# Patient Record
Sex: Female | Born: 1965 | Race: Black or African American | Hispanic: No | Marital: Married | Smoking: Never smoker
Health system: Southern US, Community
[De-identification: ages and names within clinical notes are randomized; demographics above are authoritative.]

---

## 2016-11-14 NOTE — Congregational Nurse Program (Unsigned)
Congregational Nurse Program Note  Date of Encounter: 11/14/2016  Past Medical History: No past medical history on file.  Encounter Details:     CNP Questionnaire - 11/14/16 1125      Patient Demographics   Is this a new or existing patient? Existing   Patient is considered a/an Refugee   Race African     Patient Assistance   Location of Patient Assistance Not Applicable   Patient's financial/insurance status Low Income   Uninsured Patient ([employer], trade union) No   Patient referred to apply for the following financial assistance Marketplace or to a [employer] insecurities addressed Not Applicable   Transportation assistance No   Assistance securing medications No   Educational health offerings Not Applicable     Encounter Details   Primary purpose of visit Acute Illness/Condition Visit   Was an Emergency Department visit averted? Not Applicable   Does patient have a medical provider? No   Patient referred to Not Applicable   Was a mental health screening completed? (GAINS tool) No   Does patient have dental issues? No   Does patient have vision issues? No   Does your patient have an abnormal blood pressure today? No   Does your patient have an abnormal blood glucose today? No   Since previous encounter, have you referred patient for abnormal blood glucose that resulted in a new diagnosis or medication change? No   Was there a life-saving intervention made? No      Brief nurse visit for client requesting evaluation of bruised nail on left thumb nailbed. Sustained injury from door closing on finger yesterday.  Nailbed bluish black and reddish around cuticle. No bleeding or drainage at site. Recommended warm water soaks and using OTC pain reliever; consult with pharmacy.  Return to [facility] office on 4/25 to observe thumb. Ferol Luz, RN/CN

## 2016-11-22 ENCOUNTER — Ambulatory Visit (INDEPENDENT_AMBULATORY_CARE_PROVIDER_SITE_OTHER): Payer: BLUE CROSS/BLUE SHIELD | Admitting: Urgent Care

## 2016-11-22 ENCOUNTER — Encounter: Payer: Self-pay | Admitting: Urgent Care

## 2016-11-22 VITALS — BP 144/77 | HR 106 | Temp 97.9°F | Resp 18 | Ht 64.17 in | Wt 180.4 lb

## 2016-11-22 DIAGNOSIS — R21 Rash and other nonspecific skin eruption: Secondary | ICD-10-CM

## 2016-11-22 DIAGNOSIS — R234 Changes in skin texture: Secondary | ICD-10-CM | POA: Diagnosis not present

## 2016-11-22 LAB — POCT SKIN KOH: SKIN KOH, POC: NEGATIVE

## 2016-11-22 LAB — POCT GLYCOSYLATED HEMOGLOBIN (HGB A1C): Hemoglobin A1C: 5.2

## 2016-11-22 MED ORDER — KETOCONAZOLE 2 % EX CREA: 1.0000 "application " | TOPICAL_CREAM | Freq: Every day | CUTANEOUS | 0 refills | Status: DC

## 2016-11-22 MED ORDER — BETAMETHASONE DIPROPIONATE 0.05 % EX CREA: TOPICAL_CREAM | Freq: Two times a day (BID) | CUTANEOUS | 0 refills | Status: DC

## 2016-11-22 NOTE — Progress Notes (Signed)
  MRN: [id number] DOB: [birth year]  Subjective:   Cheyenne Hubbard is a 51 y.o. female presenting to Establ[facility] Care  Rash - Reports longstanding history of different rashes over her body. All rashes are very itchy. They do not cause pain, drainage or bleeding. One large rash is over her right leg, reports that it is discolored and has used betamethasone with good results. She has another rash over her breasts that started in the past few months. And different smaller rashes over her torso. Patient is from [facility] and has been seen and treated there by a dermatologist. She would like to get a refill for her leg rash and also get worked up for her breast rash. Denies history of diabetes, autoimmune disorder.   Shaunika is not currently taking any medications. Also is allergic to quinolones. Aimie denies past medical and surgical history.  Objective:   Vitals: BP (!) 144/77 (BP Location: Right Arm, Patient Position: Sitting, Cuff Size: Small)   Pulse (!) 106   Temp 97.9 F (36.6 C) (Oral)   Resp 18   Ht 5' 4.17" (1.63 m)   Wt 180 lb 6.4 oz (81.8 kg)   LMP 10/24/2016 (Approximate)   SpO2 99%   BMI 30.80 kg/m   Physical Exam  Constitutional: She is oriented to person, place, and time. She appears well-developed and well-nourished.  HENT:  Mouth/Throat: Oropharynx is clear and moist.  Neck: Normal range of motion. Neck supple. No thyromegaly present.  Cardiovascular: Normal rate.   Pulmonary/Chest: Effort normal.  Lymphadenopathy:    She has no cervical adenopathy.  Neurological: She is alert and oriented to person, place, and time.  Skin: Skin is warm and dry. Rash noted.     Psychiatric: She has a normal mood and affect.       Results for orders placed or performed in visit on 11/22/16 (from the past 24 hour(s))  POCT Skin KOH     Status: None   Collection Time: 11/22/16  5:37 PM  Result Value Ref Range   Skin KOH, POC Negative Negative  POCT glycosylated hemoglobin (Hb A1C)      Status: None   Collection Time: 11/22/16  5:49 PM  Result Value Ref Range   Hemoglobin A1C 5.2     Assessment and Plan :   1. Rash and nonspecific skin eruption - Refilled betamethasone since she has had very good results with this rash. Counseled on use and appropriate administration of this cream. Consider biopsy versus referral to dermatology. Recheck in 2 weeks.  2. Breast skin changes - Will manage breast rash as fungal infection. Recheck in 2 weeks. - ketoconazole (NIZORAL) 2 % cream; Apply 1 application topically daily.  Dispense: 15 g; Refill: 0   Wallis Bamberg, PA-C [facility] [phone] 11/22/2016  5:19 PM

## 2016-11-22 NOTE — Patient Instructions (Addendum)
Rash A rash is a change in the color of the skin. A rash can also change the way your skin feels. There are many different conditions and factors that can cause a rash. Follow these instructions at home: Pay attention to any changes in your symptoms. Follow these instructions to help with your condition: Medicine Take or apply over-the-counter and prescription medicines only as told by your health care provider. These may include:  Corticosteroid cream.  Anti-itch lotions.  Oral antihistamines.  Skin Care  Apply cool compresses to the affected areas.  Try taking a bath with: ? Epsom salts. Follow the instructions on the packaging. You can get these at your local pharmacy or grocery store. ? Baking soda. Pour a small amount into the bath as told by your health care provider. ? Colloidal oatmeal. Follow the instructions on the packaging. You can get this at your local pharmacy or grocery store.  Try applying baking soda paste to your skin. Stir water into baking soda until it reaches a paste-like consistency.  Do not scratch or rub your skin.  Avoid covering the rash. Make sure the rash is exposed to air as much as possible. General instructions  Avoid hot showers or baths, which can make itching worse. A cold shower may help.  Avoid scented soaps, detergents, and perfumes. Use gentle soaps, detergents, perfumes, and other cosmetic products.  Avoid any substance that causes your rash. Keep a journal to help track what causes your rash. Write down: ? What you eat. ? What cosmetic products you use. ? What you drink. ? What you wear. This includes jewelry.  Keep all follow-up visits as told by your health care provider. This is important. Contact a health care provider if:  You sweat at night.  You lose weight.  You urinate more than normal.  You feel weak.  You vomit.  Your skin or the whites of your eyes look yellow (jaundice).  Your skin: ? Tingles. ? Is  numb.  Your rash: ? Does not go away after several days. ? Gets worse.  You are: ? Unusually thirsty. ? More tired than normal.  You have: ? New symptoms. ? Pain in your abdomen. ? A fever. ? Diarrhea. Get help right away if:  You develop a rash that covers all or most of your body. The rash may or may not be painful.  You develop blisters that: ? Are on top of the rash. ? Grow larger or grow together. ? Are painful. ? Are inside your nose or mouth.  You develop a rash that: ? Looks like purple pinprick-sized spots all over your body. ? Has a "bull's eye" or looks like a target. ? Is not related to sun exposure, is red and painful, and causes your skin to peel. This information is not intended to replace advice given to you by your health care provider. Make sure you discuss any questions you have with your health care provider. Document Released: 06/30/2002 Document Revised: 12/14/2015 Document Reviewed: 11/25/2014 Elsevier Interactive Patient Education  2017 Elsevier Inc.     IF you received an x-ray today, you will receive an invoice from [facility]. Please contact [facility] at [phone] with questions or concerns regarding your invoice.   IF you received labwork today, you will receive an invoice from [facility]. Please contact [facility] at [phone] with questions or concerns regarding your invoice.   Our billing staff will not be able to assist you with questions regarding bills from these   companies.  You will be contacted with the lab results as soon as they are available. The fastest way to get your results is to activate your My Chart account. Instructions are located on the last page of this paperwork. If you have not heard from us regarding the results in 2 weeks, please contact this office.      

## 2016-11-23 LAB — CBC
HEMATOCRIT: 36.3 % (ref 34.0–46.6)
HEMOGLOBIN: 11 g/dL — AB (ref 11.1–15.9)
MCH: 22.1 pg — ABNORMAL LOW (ref 26.6–33.0)
MCHC: 30.3 g/dL — AB (ref 31.5–35.7)
MCV: 73 fL — ABNORMAL LOW (ref 79–97)
Platelets: 359 10*3/uL (ref 150–379)
RBC: 4.98 x10E6/uL (ref 3.77–5.28)
RDW: 17.1 % — ABNORMAL HIGH (ref 12.3–15.4)
WBC: 8.1 10*3/uL (ref 3.4–10.8)

## 2016-11-23 LAB — ANA W/REFLEX IF POSITIVE: ANA: NEGATIVE

## 2016-11-23 LAB — BASIC METABOLIC PANEL
BUN/Creatinine Ratio: 21 (ref 9–23)
BUN: 15 mg/dL (ref 6–24)
CALCIUM: 9.3 mg/dL (ref 8.7–10.2)
CO2: 25 mmol/L (ref 18–29)
CREATININE: 0.71 mg/dL (ref 0.57–1.00)
Chloride: 103 mmol/L (ref 96–106)
GFR calc Af Amer: 116 mL/min/{1.73_m2} (ref >59)
GFR calc non Af Amer: 100 mL/min/{1.73_m2} (ref >59)
GLUCOSE: 86 mg/dL (ref 65–99)
Potassium: 4.2 mmol/L (ref 3.5–5.2)
SODIUM: 141 mmol/L (ref 134–144)

## 2016-11-23 LAB — SEDIMENTATION RATE: SED RATE: 14 mm/h (ref 0–32)

## 2016-11-23 NOTE — Addendum Note (Signed)
Addended by: Baldwin CrownJOHNSON, Shonique Pelphrey D on: 11/23/2016 04:28 PM   Modules accepted: Orders

## 2016-11-29 LAB — PATHOLOGIST SMEAR REVIEW
BASOS: 1 %
Basophils Absolute: 0.1 10*3/uL (ref 0.0–0.2)
EOS (ABSOLUTE): 0.1 10*3/uL (ref 0.0–0.4)
EOS: 2 %
HEMATOCRIT: 36.3 % (ref 34.0–46.6)
HEMOGLOBIN: 11 g/dL — AB (ref 11.1–15.9)
IMMATURE GRANS (ABS): 0 10*3/uL (ref 0.0–0.1)
IMMATURE GRANULOCYTES: 0 %
Lymphocytes Absolute: 2.8 10*3/uL (ref 0.7–3.1)
Lymphs: 34 %
MCH: 22.1 pg — AB (ref 26.6–33.0)
MCHC: 30.3 g/dL — AB (ref 31.5–35.7)
MCV: 73 fL — AB (ref 79–97)
MONOS ABS: 0.3 10*3/uL (ref 0.1–0.9)
Monocytes: 4 %
NEUTROS ABS: 4.9 10*3/uL (ref 1.4–7.0)
Neutrophils: 59 %
PATH REV WBC: NORMAL
Path Rev PLTs: NORMAL
Platelets: 359 10*3/uL (ref 150–379)
RBC: 4.98 x10E6/uL (ref 3.77–5.28)
RDW: 17.1 % — ABNORMAL HIGH (ref 12.3–15.4)
WBC: 8.1 10*3/uL (ref 3.4–10.8)

## 2016-11-29 LAB — SPECIMEN STATUS REPORT

## 2016-12-06 ENCOUNTER — Ambulatory Visit (INDEPENDENT_AMBULATORY_CARE_PROVIDER_SITE_OTHER): Payer: BLUE CROSS/BLUE SHIELD | Admitting: Urgent Care

## 2016-12-06 ENCOUNTER — Encounter: Payer: Self-pay | Admitting: Urgent Care

## 2016-12-06 VITALS — BP 124/79 | HR 96 | Temp 99.0°F | Resp 18 | Ht 64.17 in | Wt 182.0 lb

## 2016-12-06 DIAGNOSIS — D649 Anemia, unspecified: Secondary | ICD-10-CM

## 2016-12-06 DIAGNOSIS — R21 Rash and other nonspecific skin eruption: Secondary | ICD-10-CM | POA: Diagnosis not present

## 2016-12-06 DIAGNOSIS — B369 Superficial mycosis, unspecified: Secondary | ICD-10-CM | POA: Diagnosis not present

## 2016-12-06 MED ORDER — FLUCONAZOLE 150 MG PO TABS: 150.0000 mg | ORAL_TABLET | ORAL | 0 refills | Status: DC

## 2016-12-06 NOTE — Progress Notes (Signed)
   MRN: [id number] DOB: 01/02/1966  Subjective:   Cheyenne Hubbard is a 51 y.o. female presenting for follow up on skin rash, anemia.  Rash - Patient was last seen on 11/22/2016, started on ketoconazole for a breast rash. She reports significant improvement but has not achieved complete resolution. She also started betamethasone for a rash over her right anterior leg and two patches over her back. She noted improvement while on the steroid cream but did not achieve resolution and the itching has worsened.  Anemia - Denies a history of anemia, taking iron supplementation. She denies chills, cold intolerance, pica, dizziness, heart racing.    Janann AugustHelene has a current medication list which includes the following prescription(s): betamethasone dipropionate and ketoconazole. Also is allergic to quinolones.  Janann AugustHelene  has no past medical history on file. Also  has no past surgical history on file.  Objective:   Vitals: BP 124/79   Pulse 96   Temp 99 F (37.2 C) (Oral)   Resp 18   Ht 5' 4.17" (1.63 m)   Wt 182 lb (82.6 kg)   LMP 12/05/2016   SpO2 99%   BMI 31.07 kg/m   Physical Exam  Constitutional: She is oriented to person, place, and time. She appears well-developed and well-nourished.  HENT:  Mouth/Throat: Oropharynx is clear and moist.  Cardiovascular: Normal rate, regular rhythm and intact distal pulses.  Exam reveals no gallop and no friction rub.   No murmur heard. Pulmonary/Chest: No respiratory distress. She has no wheezes. She has no rales.    Neurological: She is alert and oriented to person, place, and time.  Skin: Skin is warm and dry.     Psychiatric: She has a normal mood and affect.   Assessment and Plan :   1. Anemia, unspecified type - Unclear etiology, patient is asymptomatic, labs pending. Follow up with results. - CBC with Differential/Platelet - Ferritin - Iron and TIBC  2. Fungal infection of skin 3. Rash and nonspecific skin eruption - Given  improvement with ketoconazole cream, I will have patient take diflucan once weekly for 4 weeks. Follow up thereafter. - I will refer patient for biopsies and continued management of her lower leg rash to dermatology. - Ambulatory referral to Dermatology   Wallis BambergMario Levis Nazir, PA-C [facility] [phone] 12/06/2016 5:40 PM

## 2016-12-06 NOTE — Patient Instructions (Addendum)
I am going to refer you to a dermatologist for a biopsy of your lower leg rash. Please be on the look out for a call regarding your appointment. In the meantime, I want you to use Zyrtec and Zantac for itching. Please use betamethasone for one week at a time and then give your skin a 2 week break.     IF you received an x-ray today, you will receive an invoice from [facility]. Please contact [facility] at [phone] with questions or concerns regarding your invoice.   IF you received labwork today, you will receive an invoice from [facility]. Please contact [facility] at [phone] with questions or concerns regarding your invoice.   Our billing staff will not be able to assist you with questions regarding bills from these companies.  You will be contacted with the lab results as soon as they are available. The fastest way to get your results is to activate your My Chart account. Instructions are located on the last page of this paperwork. If you have not heard from us regarding the results in 2 weeks, please contact this office.

## 2016-12-07 LAB — CBC WITH DIFFERENTIAL/PLATELET
BASOS: 1 %
Basophils Absolute: 0 10*3/uL (ref 0.0–0.2)
EOS (ABSOLUTE): 0.1 10*3/uL (ref 0.0–0.4)
EOS: 1 %
HEMOGLOBIN: 10.9 g/dL — AB (ref 11.1–15.9)
Hematocrit: 36.2 % (ref 34.0–46.6)
IMMATURE GRANS (ABS): 0 10*3/uL (ref 0.0–0.1)
IMMATURE GRANULOCYTES: 0 %
LYMPHS: 31 %
Lymphocytes Absolute: 2.2 10*3/uL (ref 0.7–3.1)
MCH: 21.9 pg — AB (ref 26.6–33.0)
MCHC: 30.1 g/dL — ABNORMAL LOW (ref 31.5–35.7)
MCV: 73 fL — AB (ref 79–97)
MONOCYTES: 6 %
Monocytes Absolute: 0.4 10*3/uL (ref 0.1–0.9)
NEUTROS ABS: 4.4 10*3/uL (ref 1.4–7.0)
NEUTROS PCT: 61 %
PLATELETS: 327 10*3/uL (ref 150–379)
RBC: 4.97 x10E6/uL (ref 3.77–5.28)
RDW: 16.8 % — ABNORMAL HIGH (ref 12.3–15.4)
WBC: 7.2 10*3/uL (ref 3.4–10.8)

## 2016-12-07 LAB — FERRITIN: Ferritin: 36 ng/mL (ref 15–150)

## 2016-12-07 LAB — IRON AND TIBC
IRON SATURATION: 12 % — AB (ref 15–55)
IRON: 33 ug/dL (ref 27–159)
Total Iron Binding Capacity: 266 ug/dL (ref 250–450)
UIBC: 233 ug/dL (ref 131–425)

## 2016-12-11 ENCOUNTER — Other Ambulatory Visit: Payer: Self-pay | Admitting: Urgent Care

## 2016-12-11 MED ORDER — FERROUS SULFATE 325 (65 FE) MG PO TABS: 325.0000 mg | ORAL_TABLET | Freq: Two times a day (BID) | ORAL | 2 refills | Status: DC

## 2017-01-09 ENCOUNTER — Encounter: Payer: Self-pay | Admitting: Urgent Care

## 2017-01-09 ENCOUNTER — Ambulatory Visit (INDEPENDENT_AMBULATORY_CARE_PROVIDER_SITE_OTHER): Payer: BLUE CROSS/BLUE SHIELD | Admitting: Urgent Care

## 2017-01-09 VITALS — BP 114/75 | HR 104 | Temp 98.2°F | Resp 18 | Ht 64.0 in | Wt 184.2 lb

## 2017-01-09 DIAGNOSIS — R21 Rash and other nonspecific skin eruption: Secondary | ICD-10-CM | POA: Diagnosis not present

## 2017-01-09 DIAGNOSIS — L418 Other parapsoriasis: Secondary | ICD-10-CM | POA: Diagnosis not present

## 2017-01-09 DIAGNOSIS — B3789 Other sites of candidiasis: Secondary | ICD-10-CM

## 2017-01-09 NOTE — Progress Notes (Signed)
    MRN: [id number] DOB: 14-Oct-1965  Subjective:   Cheyenne BoutonHelene Ngoy Hubbard is a 51 y.o. female presenting for follow up on rashes.  Breast candidiasis - Patient reports dramatic improvement. She has undergone both treatment with topical ketoconazole followed by oral diflucan. Denies itching, breast rash.  Leg rash - Reports ongoing recurrent right lower leg rash. She has undergone treatment with betamethasone 0.05% for an undifferentiated rash which is what she used while in [place]. She reports improvement while on the steroid cream but once she stops using it after a week the rash comes back. Patient has been referred to dermatology but the appointment is scheduled for 01/30/2017.  Cheyenne AugustHelene has a current medication list which includes the following prescription(s): betamethasone dipropionate, ferrous sulfate, and ketoconazole. Also is allergic to quinolones. Cheyenne AugustHelene denies past medical and surgical history.  Objective:   Vitals: BP 114/75   Pulse (!) 104   Temp 98.2 F (36.8 C) (Oral)   Resp 18   Ht 5\' 4"  (1.626 m)   Wt 184 lb 3.2 oz (83.6 kg)   SpO2 100%   BMI 31.62 kg/m   Pulse was 80 on recheck by PA-Shamicka Inga.  Physical Exam  Constitutional: She is oriented to person, place, and time. She appears well-developed and well-nourished.  Cardiovascular: Normal rate.   Pulmonary/Chest: Effort normal.  Neurological: She is alert and oriented to person, place, and time.  Skin:      PROCEDURE NOTE: Punch biopsy Verbal consent obtain from the patient.  Skin cleansed with alcohol pad, then local anesthesia with 1 cc 1% lidocaine with epinephrine.   A 3mm punch biopsy performed and specimen sent for pathology review. Hemostasis achieved with silver nitrate and pressure. Bandage applied. Local wound care reviewed.   Assessment and Plan :   This case was precepted with Dr. Alwyn RenHopper.   1. Rash and nonspecific skin eruption - Biopsy obtained, hold off on additional steroid cream use for now.  -  Dermatology pathology  2. Candidiasis of breast - Resolved.  Wallis BambergMario Jonee Lamore, PA-C [facility] [phone] 01/09/2017 5:50 PM

## 2017-01-09 NOTE — Patient Instructions (Addendum)
Change your dressing daily until the wound closes. Come back in 1 week to review your results and wound.    Skin Biopsy, Care After Refer to this sheet in the next few weeks. These instructions provide you with information about caring for yourself after your procedure. Your health care provider may also give you more specific instructions. Your treatment has been planned according to current medical practices, but problems sometimes occur. Call your health care provider if you have any problems or questions after your procedure. What can I expect after the procedure? After the procedure, it is common to have:  Soreness.  Bruising.  Itching.  Follow these instructions at home:  Rest and then return to your normal activities as told by your health care provider.  Take over-the-counter and prescription medicines only as told by your health care provider.  Follow instructions from your health care provider about how to take care of your biopsy site.Make sure you: ? Wash your hands with soap and water before you change your bandage (dressing). If soap and water are not available, use hand sanitizer. ? Change your dressing as told by your health care provider. ? Leave stitches (sutures), skin glue, or adhesive strips in place. These skin closures may need to stay in place for 2 weeks or longer. If adhesive strip edges start to loosen and curl up, you may trim the loose edges. Do not remove adhesive strips completely unless your health care provider tells you to do that. If the biopsy area bleeds, apply gentle pressure for 10 minutes.  Check your biopsy site every day for signs of infection. Check for: ? More redness, swelling, or pain. ? More fluid or blood. ? Warmth. ? Pus or a bad smell.  Keep all follow-up visits as told by your health care provider. This is important. Contact a health care provider if:  You have more redness, swelling, or pain around your biopsy site.  You have  more fluid or blood coming from your biopsy site.  Your biopsy site feels warm to the touch.  You have pus or a bad smell coming from your biopsy site.  You have a fever. Get help right away if:  You have bleeding that does not stop with pressure or a dressing. This information is not intended to replace advice given to you by your health care provider. Make sure you discuss any questions you have with your health care provider. Document Released: 08/06/2015 Document Revised: 03/05/2016 Document Reviewed: 10/07/2014 Elsevier Interactive Patient Education  2018 [employer]     IF you received an x-ray today, you will receive an invoice from [facility]. Please contact [facility] at [phone] with questions or concerns regarding your invoice.   IF you received labwork today, you will receive an invoice from [facility]. Please contact [facility] at [phone] with questions or concerns regarding your invoice.   Our billing staff will not be able to assist you with questions regarding bills from these companies.  You will be contacted with the lab results as soon as they are available. The fastest way to get your results is to activate your My Chart account. Instructions are located on the last page of this paperwork. If you have not heard from us regarding the results in 2 weeks, please contact this office.

## 2017-01-16 ENCOUNTER — Ambulatory Visit (INDEPENDENT_AMBULATORY_CARE_PROVIDER_SITE_OTHER): Payer: BLUE CROSS/BLUE SHIELD | Admitting: Urgent Care

## 2017-01-16 ENCOUNTER — Encounter: Payer: Self-pay | Admitting: Urgent Care

## 2017-01-16 VITALS — BP 110/64 | HR 89 | Temp 98.8°F | Resp 16 | Ht 63.5 in | Wt 187.8 lb

## 2017-01-16 DIAGNOSIS — R21 Rash and other nonspecific skin eruption: Secondary | ICD-10-CM

## 2017-01-16 DIAGNOSIS — T148XXA Other injury of unspecified body region, initial encounter: Secondary | ICD-10-CM

## 2017-01-16 DIAGNOSIS — L089 Local infection of the skin and subcutaneous tissue, unspecified: Secondary | ICD-10-CM

## 2017-01-16 MED ORDER — CEPHALEXIN 500 MG PO CAPS
500.0000 mg | ORAL_CAPSULE | Freq: Three times a day (TID) | ORAL | 0 refills | Status: DC
Start: 2017-01-16 — End: 2018-04-05

## 2017-01-16 NOTE — Progress Notes (Signed)
    MRN: [id number] DOB: 1965-08-27  Subjective:   Cheyenne Hubbard is a 51 y.o. female presenting for follow up on lower leg rash. Patient had a punch biopsy performed on 01/09/2017. Results were reviewed with patient. Today, patient reports that she has changed her dressings daily. Denies pain, fever, redness. Has had some drainage.    Cheyenne Hubbard has a current medication list which includes the following prescription(s): betamethasone dipropionate, ferrous sulfate, and ketoconazole. Also is allergic to quinolones. Cheyenne Hubbard denies past medical and surgical history.   Objective:   Vitals: BP 110/64 (BP Location: Right Arm, Patient Position: Sitting, Cuff Size: Large)   Pulse 89   Temp 98.8 F (37.1 C) (Oral)   Resp 16   Ht 5' 3.5" (1.613 m)   Wt 187 lb 12.8 oz (85.2 kg)   LMP 01/01/2017   SpO2 100%   BMI 32.75 kg/m   Physical Exam  Constitutional: She is oriented to person, place, and time. She appears well-developed and well-nourished.  Cardiovascular: Normal rate.   Pulmonary/Chest: Effort normal.  Neurological: She is alert and oriented to person, place, and time.  Skin:      WOUND CARE - Dressing removed. Non-viable tissue debrided with forceps and Iris scissors. Cleansed and dressed.  Assessment and Plan :   1. Rash and nonspecific skin eruption 2. Wound infection - Start keflex to address wound infection. Wound care reviewed. Patient is to keep appointment with dermatology. Follow up in 1 week for wound recheck.  Wallis BambergMario Doshia Dalia, PA-C [facility] [phone] 01/16/2017 5:32 PM

## 2017-01-16 NOTE — Patient Instructions (Addendum)
Change your dressing daily until your wound closes. You may use the betamethasone cream for one more week. Please keep your appointment with dermatology to keep working on your rash.    IF you received an x-ray today, you will receive an invoice from [facility]. Please contact [facility] at [phone] with questions or concerns regarding your invoice.   IF you received labwork today, you will receive an invoice from [facility]. Please contact [facility] at [phone] with questions or concerns regarding your invoice.   Our billing staff will not be able to assist you with questions regarding bills from these companies.  You will be contacted with the lab results as soon as they are available. The fastest way to get your results is to activate your My Chart account. Instructions are located on the last page of this paperwork. If you have not heard from us regarding the results in 2 weeks, please contact this office.    We recommend that you schedule a mammogram for breast cancer screening. Typically, you do not need a referral to do this. Please contact a local imaging center to schedule your mammogram.  [facility] - [phone]  *ask for the Radiology Department [facility] - [phone] or [phone]  [facility] - [phone] [facility] - [phone] [facility] - [phone]  *ask for the Radiology Department [facility] - [phone]  *ask for the Radiology Department [facility] - [phone]  *ask for the Mammography Department [facility] - [phone]

## 2017-01-27 ENCOUNTER — Ambulatory Visit (INDEPENDENT_AMBULATORY_CARE_PROVIDER_SITE_OTHER): Payer: BLUE CROSS/BLUE SHIELD | Admitting: Physician Assistant

## 2017-01-27 VITALS — BP 115/72 | HR 77 | Temp 98.5°F | Resp 16 | Ht 64.0 in | Wt 189.3 lb

## 2017-01-27 DIAGNOSIS — Z23 Encounter for immunization: Secondary | ICD-10-CM

## 2017-01-27 DIAGNOSIS — Z789 Other specified health status: Secondary | ICD-10-CM | POA: Diagnosis not present

## 2017-01-27 DIAGNOSIS — Z111 Encounter for screening for respiratory tuberculosis: Secondary | ICD-10-CM

## 2017-01-27 DIAGNOSIS — R21 Rash and other nonspecific skin eruption: Secondary | ICD-10-CM | POA: Diagnosis not present

## 2017-01-27 NOTE — Patient Instructions (Addendum)
I will contact     IF you received an x-ray today, you will receive an invoice from [facility]. Please contact [facility] at [phone] with questions or concerns regarding your invoice.   IF you received labwork today, you will receive an invoice from [facility]. Please contact [facility] at [phone] with questions or concerns regarding your invoice.   Our billing staff will not be able to assist you with questions regarding bills from these companies.  You will be contacted with the lab results as soon as they are available. The fastest way to get your results is to activate your My Chart account. Instructions are located on the last page of this paperwork. If you have not heard from us regarding the results in 2 weeks, please contact this office.

## 2017-01-28 NOTE — Progress Notes (Signed)
PRIMARY CARE AT [place], [place] [address] [phone]  Date:  01/27/2017   Name:  [name]   DOB:  1965/11/26   MRN:  [id number]  PCP:  Patient, No Pcp Per    History of Present Illness:  Cheyenne Hubbard is a 51 y.o. female patient who presents to PCP with  Chief Complaint  Patient presents with  . Leg Problem    wound care right leg     Patient is here for wound care.  She has had a rash of the right lower leg.  Skin biopsy 18 days ago appears to be paraphimosis.  She is covering her wound.  The biopsy area is healing without drainage.   July 10th is her date with dermatology for consult.    Patient Active Problem List   Diagnosis Date Noted  . Fungal infection of skin 12/06/2016  . Rash and nonspecific skin eruption 12/06/2016  . Anemia 12/06/2016    No past medical history on file.  No past surgical history on file.  Social History  Substance Use Topics  . Smoking status: Never Smoker  . Smokeless tobacco: Never Used  . Alcohol use Yes     Comment: occ    Family History  Problem Relation Age of Onset  . Hypertension Mother   . Hypertension Father     Allergies  Allergen Reactions  . Quinolones Itching    Medication list has been reviewed and updated.  Current Outpatient Prescriptions on File Prior to Visit  Medication Sig Dispense Refill  . betamethasone dipropionate (DIPROLENE) 0.05 % cream Apply topically 2 (two) times daily. 30 g 0  . cephALEXin (KEFLEX) 500 MG capsule Take 1 capsule (500 mg total) by mouth 3 (three) times daily. 30 capsule 0  . ferrous sulfate 325 (65 FE) MG tablet Take 1 tablet (325 mg total) by mouth 2 (two) times daily with a meal. 60 tablet 2   No current facility-administered medications on file prior to visit.     ROS ROS otherwise unremarkable unless listed above.  Physical Examination: BP 115/72   Pulse 77   Temp 98.5 F (36.9 C) (Oral)   Resp 16   Ht 5' 4"  (1.626 m)   Wt 189 lb 4.8 oz  (85.9 kg)   LMP 01/01/2017   SpO2 99%   BMI 32.49 kg/m  Ideal Body Weight: Weight in (lb) to have BMI = 25: 145.3  Physical Exam  Constitutional: She is oriented to person, place, and time. She appears well-developed and well-nourished. No distress.  HENT:  Head: Normocephalic and atraumatic.  Right Ear: External ear normal.  Left Ear: External ear normal.  Eyes: Conjunctivae and EOM are normal. Pupils are equal, round, and reactive to light.  Cardiovascular: Normal rate.   Pulmonary/Chest: Effort normal. No respiratory distress.  Neurological: She is alert and oriented to person, place, and time.  Skin: Skin is warm and dry. Capillary refill takes less than 2 seconds. She is not diaphoretic.  Right leg biopsy area, is healing well.  No drainage of purulent or sanguinous fluid.    Psychiatric: She has a normal mood and affect. Her behavior is normal.     Assessment and Plan: Cheyenne Hubbard is a 51 y.o. female who is here today for rash and immunizations for work. Biopsy healing well.  Continue to go to dermatology visit.   --Screening-pulmonary TB - Plan: Quantiferon tb gold assay (blood)  Varicella vaccination status unknown -  Plan: Varicella Zoster Abs, IgG/IgM  Need for tetanus booster - Plan: Td vaccine greater than or equal to 7yo preservative free IM  Measles, mumps, rubella (MMR) vaccination status unknown - Plan: Measles/Mumps/Rubella Immunity  Ivar Drape, PA-C [facility] 7/9/20188:21 AM

## 2017-01-29 ENCOUNTER — Encounter: Payer: Self-pay | Admitting: Physician Assistant

## 2017-01-29 LAB — MEASLES/MUMPS/RUBELLA IMMUNITY
MUMPS ABS, IGG: 179 [AU]/ml (ref >10.9)
Rubella Antibodies, IGG: 18.5 index (ref >0.99)

## 2017-01-29 LAB — VARICELLA ZOSTER ABS, IGG/IGM: Varicella zoster IgG: 1133 index (ref >165)

## 2017-01-30 DIAGNOSIS — L309 Dermatitis, unspecified: Secondary | ICD-10-CM | POA: Diagnosis not present

## 2017-01-30 LAB — QUANTIFERON IN TUBE
QFT TB AG MINUS NIL VALUE: <0 IU/mL
QUANTIFERON MITOGEN VALUE: 8.9 IU/mL
QUANTIFERON NIL VALUE: 0.08 [IU]/mL
QUANTIFERON TB AG VALUE: 0.06 IU/mL
QUANTIFERON TB GOLD: NEGATIVE

## 2017-01-30 LAB — QUANTIFERON TB GOLD ASSAY (BLOOD)

## 2017-01-31 ENCOUNTER — Encounter: Payer: Self-pay | Admitting: Radiology

## 2017-02-27 DIAGNOSIS — L409 Psoriasis, unspecified: Secondary | ICD-10-CM | POA: Diagnosis not present

## 2017-04-24 DIAGNOSIS — L309 Dermatitis, unspecified: Secondary | ICD-10-CM | POA: Diagnosis not present

## 2017-06-06 DIAGNOSIS — L309 Dermatitis, unspecified: Secondary | ICD-10-CM | POA: Diagnosis not present

## 2017-08-16 DIAGNOSIS — L309 Dermatitis, unspecified: Secondary | ICD-10-CM | POA: Diagnosis not present

## 2017-10-03 NOTE — Congregational Nurse Program (Unsigned)
Congregational Nurse Program Note  Date of Encounter: 10/03/2017  Past Medical History: No past medical history on file.  Encounter Details: CNP Questionnaire - 10/03/17 1432      Questionnaire   Patient Status  Refugee    Race  African    Location Patient Served At  Not Applicable    Medical Provider  No    Referrals  Not Applicable    ED Visit Averted  Not Applicable      Office visit with complaint of intermittent pain in left heel while walking. Pain noticeable wearing certain type of shoe. Wearing a flat canvas type shoe without elevation. Denies injury. No swelling or deformity of foot. Skin intact and color normal.Slight pain to pressure over talus area. Recommended changing shoes; warm soaks for foot and OTC for pain. Contact PCP for evaluation and referral. Return for further assistance prn. Ferol LuzMarietta Harles Evetts, RN/CN

## 2017-10-23 ENCOUNTER — Encounter: Payer: Self-pay | Admitting: Physician Assistant

## 2018-04-05 ENCOUNTER — Other Ambulatory Visit: Payer: Self-pay

## 2018-04-05 ENCOUNTER — Ambulatory Visit (INDEPENDENT_AMBULATORY_CARE_PROVIDER_SITE_OTHER): Payer: BLUE CROSS/BLUE SHIELD | Admitting: Urgent Care

## 2018-04-05 ENCOUNTER — Encounter: Payer: Self-pay | Admitting: Urgent Care

## 2018-04-05 VITALS — BP 146/82 | HR 79 | Temp 98.2°F | Resp 18 | Ht 64.0 in | Wt 184.8 lb

## 2018-04-05 DIAGNOSIS — R030 Elevated blood-pressure reading, without diagnosis of hypertension: Secondary | ICD-10-CM

## 2018-04-05 DIAGNOSIS — E669 Obesity, unspecified: Secondary | ICD-10-CM

## 2018-04-05 DIAGNOSIS — Z1329 Encounter for screening for other suspected endocrine disorder: Secondary | ICD-10-CM | POA: Diagnosis not present

## 2018-04-05 DIAGNOSIS — Z13220 Encounter for screening for lipoid disorders: Secondary | ICD-10-CM | POA: Diagnosis not present

## 2018-04-05 DIAGNOSIS — Z130 Encounter for screening for diseases of the blood and blood-forming organs and certain disorders involving the immune mechanism: Secondary | ICD-10-CM

## 2018-04-05 DIAGNOSIS — Z114 Encounter for screening for human immunodeficiency virus [HIV]: Secondary | ICD-10-CM

## 2018-04-05 DIAGNOSIS — R21 Rash and other nonspecific skin eruption: Secondary | ICD-10-CM

## 2018-04-05 DIAGNOSIS — Z124 Encounter for screening for malignant neoplasm of cervix: Secondary | ICD-10-CM

## 2018-04-05 DIAGNOSIS — Z1211 Encounter for screening for malignant neoplasm of colon: Secondary | ICD-10-CM

## 2018-04-05 DIAGNOSIS — Z0001 Encounter for general adult medical examination with abnormal findings: Secondary | ICD-10-CM | POA: Diagnosis not present

## 2018-04-05 DIAGNOSIS — Z1231 Encounter for screening mammogram for malignant neoplasm of breast: Secondary | ICD-10-CM

## 2018-04-05 DIAGNOSIS — Z13228 Encounter for screening for other metabolic disorders: Secondary | ICD-10-CM

## 2018-04-05 DIAGNOSIS — Z0000 Encounter for general adult medical examination without abnormal findings: Secondary | ICD-10-CM

## 2018-04-05 DIAGNOSIS — Z1239 Encounter for other screening for malignant neoplasm of breast: Secondary | ICD-10-CM

## 2018-04-05 DIAGNOSIS — L300 Nummular dermatitis: Secondary | ICD-10-CM

## 2018-04-05 MED ORDER — HYDROXYZINE HCL 25 MG PO TABS: 12.5000 mg | ORAL_TABLET | Freq: Three times a day (TID) | ORAL | 5 refills | Status: DC | PRN

## 2018-04-05 MED ORDER — TRIAMCINOLONE ACETONIDE 0.1 % EX CREA: 1.0000 "application " | TOPICAL_CREAM | Freq: Two times a day (BID) | CUTANEOUS | 1 refills | Status: DC

## 2018-04-05 NOTE — Progress Notes (Signed)
MRN: [id number]  Subjective:   Cheyenne Hubbard is a 52 y.o. female presenting for annual physical exam. Patient is happily married. Denies smoking cigarettes or drinking alcohol. Has good relationships at home, has a good support network. Patient is a Arts development officerhome maker.   Medical care team includes: PCP: Patient, No Pcp Per OB/GYN: Patient is due for pap smear, breast cancer screening. Specialists: Dermatologist. Patient would like another referral. She was prescribed a triamcinolone cream for a persistent lower leg rash and had no follow up.  Health Maintenance: Due for colonoscopy.    Cheyenne AugustHelene is not currently taking any medications.  She is allergic to quinolones. Cheyenne AugustHelene has pmh of rash. Denies past surgical history. Her family history includes Hypertension in her father and mother.  Review of Systems  Constitutional: Negative for chills, diaphoresis, fever, malaise/fatigue and weight loss.  HENT: Negative for congestion, ear discharge, ear pain, hearing loss, nosebleeds, sore throat and tinnitus.   Eyes: Negative for blurred vision, double vision, photophobia, pain, discharge and redness.  Respiratory: Negative for cough, shortness of breath and wheezing.   Cardiovascular: Negative for chest pain, palpitations and leg swelling.  Gastrointestinal: Negative for abdominal pain, blood in stool, constipation, diarrhea, nausea and vomiting.  Genitourinary: Negative for dysuria, flank pain, frequency, hematuria and urgency.  Musculoskeletal: Negative for back pain, joint pain and myalgias.  Skin: Negative for itching and rash.  Neurological: Negative for dizziness, tingling, seizures, loss of consciousness, weakness and headaches.  Endo/Heme/Allergies: Negative for polydipsia.  Psychiatric/Behavioral: Negative for depression, hallucinations, memory loss, substance abuse and suicidal ideas. The patient is not nervous/anxious and does not have insomnia.     Objective:   Vitals: BP (!)  146/82   Pulse 79   Temp 98.2 F (36.8 C) (Oral)   Resp 18   Ht 5\' 4"  (1.626 m)   Wt 184 lb 12.8 oz (83.8 kg)   SpO2 99%   BMI 31.72 kg/m   BP Readings from Last 3 Encounters:  04/05/18 (!) 146/82  10/03/17 115/68  01/27/17 115/72    Wt Readings from Last 3 Encounters:  04/05/18 184 lb 12.8 oz (83.8 kg)  01/27/17 189 lb 4.8 oz (85.9 kg)  01/16/17 187 lb 12.8 oz (85.2 kg)    Physical Exam  Constitutional: She is oriented to person, place, and time. She appears well-developed and well-nourished.  HENT:  TM's intact bilaterally, no effusions or erythema. Nasal turbinates pink and moist, nasal passages patent. No sinus tenderness. Oropharynx clear, mucous membranes moist, dentition in good repair.  Eyes: Pupils are equal, round, and reactive to light. Conjunctivae and EOM are normal. Right eye exhibits no discharge. Left eye exhibits no discharge. No scleral icterus.  Neck: Normal range of motion. Neck supple. No thyromegaly present.  Cardiovascular: Normal rate, regular rhythm and intact distal pulses. Exam reveals no gallop and no friction rub.  No murmur heard. Pulmonary/Chest: No respiratory distress. She has no wheezes. She has no rales.  Abdominal: Soft. Bowel sounds are normal. She exhibits no distension and no mass. There is no tenderness. There is no rebound and no guarding.  Musculoskeletal: Normal range of motion. She exhibits no edema or tenderness.  Lymphadenopathy:    She has no cervical adenopathy.  Neurological: She is alert and oriented to person, place, and time. She has normal reflexes. She displays normal reflexes. Coordination normal.  Skin: Skin is warm and dry. Rash (as depicted) noted. No erythema. No pallor.  Psychiatric: She has a normal mood and affect.  Assessment and Plan :   Annual physical exam  Screening for deficiency anemia  Screening for thyroid disorder  Screening for lipid disorders  Screening for metabolic disorder  Rash and  nonspecific skin eruption - Plan: HIV Antibody (routine testing w rflx), Ambulatory referral to Dermatology  Elevated blood pressure reading - Plan: Comprehensive metabolic panel, TSH, CBC  Screening for HIV (human immunodeficiency virus) - Plan: HIV Antibody (routine testing w rflx)  Obesity without serious comorbidity, unspecified classification, unspecified obesity type - Plan: Lipid panel  Pap smear for cervical cancer screening - Plan: Ambulatory referral to Gynecology  Screen for colon cancer - Plan: Ambulatory referral to Gastroenterology  Screening for breast cancer - Plan: MM Digital Screening  Nummular eczematous dermatitis  Referral to a different dermatologist.  Patient previously had treatment for fungal infection, nummular eczematous dermatitis that shown by skin biopsy.  She continues to use steroid cream alternating every other 2 weeks between uses.  Otherwise she reports that she is in good health.  She does not know why her blood pressure is up today, does not have a history of hypertension and her previous readings have been good.  She is otherwise largely asymptomatic.  Screening for her health maintenance includes mammogram, colonoscopy, Pap smear.  Referrals are pending.  Patient refused the flu vaccine today.  She would like to come back with all her family to get it together. Discussed healthy lifestyle, diet, exercise, preventative care, vaccinations, and addressed patient's concerns.     Wallis Bamberg, PA-C Primary Care at [facility] [phone] 04/05/2018  1:49 PM

## 2018-04-05 NOTE — Patient Instructions (Addendum)

## 2018-04-06 LAB — CBC
HEMATOCRIT: 37.2 % (ref 34.0–46.6)
HEMOGLOBIN: 10.8 g/dL — AB (ref 11.1–15.9)
MCH: 21.6 pg — AB (ref 26.6–33.0)
MCHC: 29 g/dL — AB (ref 31.5–35.7)
MCV: 74 fL — ABNORMAL LOW (ref 79–97)
Platelets: 348 10*3/uL (ref 150–450)
RBC: 5.01 x10E6/uL (ref 3.77–5.28)
RDW: 14.4 % (ref 12.3–15.4)
WBC: 5.8 10*3/uL (ref 3.4–10.8)

## 2018-04-06 LAB — COMPREHENSIVE METABOLIC PANEL
A/G RATIO: 1.3 (ref 1.2–2.2)
ALK PHOS: 49 IU/L (ref 39–117)
ALT: 15 IU/L (ref 0–32)
AST: 15 IU/L (ref 0–40)
Albumin: 3.9 g/dL (ref 3.5–5.5)
BILIRUBIN TOTAL: 0.3 mg/dL (ref 0.0–1.2)
BUN/Creatinine Ratio: 13 (ref 9–23)
BUN: 9 mg/dL (ref 6–24)
CHLORIDE: 104 mmol/L (ref 96–106)
CO2: 23 mmol/L (ref 20–29)
Calcium: 8.9 mg/dL (ref 8.7–10.2)
Creatinine, Ser: 0.68 mg/dL (ref 0.57–1.00)
GFR calc Af Amer: 118 mL/min/{1.73_m2} (ref >59)
GFR, EST NON AFRICAN AMERICAN: 102 mL/min/{1.73_m2} (ref >59)
Globulin, Total: 2.9 g/dL (ref 1.5–4.5)
Glucose: 79 mg/dL (ref 65–99)
POTASSIUM: 3.6 mmol/L (ref 3.5–5.2)
Sodium: 140 mmol/L (ref 134–144)
Total Protein: 6.8 g/dL (ref 6.0–8.5)

## 2018-04-06 LAB — HIV ANTIBODY (ROUTINE TESTING W REFLEX): HIV SCREEN 4TH GENERATION: NONREACTIVE

## 2018-04-06 LAB — LIPID PANEL
CHOL/HDL RATIO: 2.5 ratio (ref 0.0–4.4)
Cholesterol, Total: 123 mg/dL (ref 100–199)
HDL: 50 mg/dL (ref >39)
LDL Calculated: 66 mg/dL (ref 0–99)
TRIGLYCERIDES: 35 mg/dL (ref 0–149)
VLDL Cholesterol Cal: 7 mg/dL (ref 5–40)

## 2018-04-06 LAB — TSH: TSH: 1.58 u[IU]/mL (ref 0.450–4.500)

## 2018-04-09 DIAGNOSIS — L400 Psoriasis vulgaris: Secondary | ICD-10-CM | POA: Diagnosis not present

## 2018-04-25 ENCOUNTER — Encounter: Payer: Self-pay | Admitting: Obstetrics and Gynecology

## 2018-04-25 ENCOUNTER — Other Ambulatory Visit (HOSPITAL_COMMUNITY)
Admission: RE | Admit: 2018-04-25 | Discharge: 2018-04-25 | Disposition: A | Discharge status: Home / Self Care | Payer: BLUE CROSS/BLUE SHIELD | Source: Ambulatory Visit | Attending: Obstetrics and Gynecology | Admitting: Obstetrics and Gynecology

## 2018-04-25 ENCOUNTER — Other Ambulatory Visit: Payer: Self-pay

## 2018-04-25 ENCOUNTER — Ambulatory Visit: Payer: BLUE CROSS/BLUE SHIELD | Admitting: Obstetrics and Gynecology

## 2018-04-25 VITALS — BP 142/86 | HR 100 | Ht 64.0 in | Wt 185.4 lb

## 2018-04-25 DIAGNOSIS — Z01419 Encounter for gynecological examination (general) (routine) without abnormal findings: Secondary | ICD-10-CM

## 2018-04-25 DIAGNOSIS — Z124 Encounter for screening for malignant neoplasm of cervix: Secondary | ICD-10-CM | POA: Diagnosis not present

## 2018-04-25 DIAGNOSIS — R030 Elevated blood-pressure reading, without diagnosis of hypertension: Secondary | ICD-10-CM

## 2018-04-25 DIAGNOSIS — Z862 Personal history of diseases of the blood and blood-forming organs and certain disorders involving the immune mechanism: Secondary | ICD-10-CM | POA: Diagnosis not present

## 2018-04-25 NOTE — Progress Notes (Signed)
Patient's blood pressure rechecked in right arm while sitting. Reading was 142/86. Patient advised to follow up with PCP for elevated reading. Patient agreeable.

## 2018-04-25 NOTE — Patient Instructions (Signed)
EXERCISE AND DIET:  We recommended that you start or continue a regular exercise program for good health. Regular exercise means any activity that makes your heart beat faster and makes you sweat.  We recommend exercising at least 30 minutes per day at least 3 days a week, preferably 4 or 5.  We also recommend a diet low in fat and sugar.  Inactivity, poor dietary choices and obesity can cause diabetes, heart attack, stroke, and kidney damage, among others.    ALCOHOL AND SMOKING:  Women should limit their alcohol intake to no more than 7 drinks/beers/glasses of wine (combined, not each!) per week. Moderation of alcohol intake to this level decreases your risk of breast cancer and liver damage. And of course, no recreational drugs are part of a healthy lifestyle.  And absolutely no smoking or even second hand smoke. Most people know smoking can cause heart and lung diseases, but did you know it also contributes to weakening of your bones? Aging of your skin?  Yellowing of your teeth and nails?  CALCIUM AND VITAMIN D:  Adequate intake of calcium and Vitamin D are recommended.  The recommendations for exact amounts of these supplements seem to change often, but generally speaking 600 mg of calcium (either carbonate or citrate) and 800 units of Vitamin D per day seems prudent. Certain women may benefit from higher intake of Vitamin D.  If you are among these women, your doctor will have told you during your visit.    PAP SMEARS:  Pap smears, to check for cervical cancer or precancers,  have traditionally been done yearly, although recent scientific advances have shown that most women can have pap smears less often.  However, every woman still should have a physical exam from her gynecologist every year. It will include a breast check, inspection of the vulva and vagina to check for abnormal growths or skin changes, a visual exam of the cervix, and then an exam to evaluate the size and shape of the uterus and  ovaries.  And after 50 years of age, a rectal exam is indicated to check for rectal cancers. We will also provide age appropriate advice regarding health maintenance, like when you should have certain vaccines, screening for sexually transmitted diseases, bone density testing, colonoscopy, mammograms, etc.   MAMMOGRAMS:  All women over 40 years old should have a yearly mammogram. Many facilities now offer a "3D" mammogram, which may cost around $50 extra out of pocket. If possible,  we recommend you accept the option to have the 3D mammogram performed.  It both reduces the number of women who will be called back for extra views which then turn out to be normal, and it is better than the routine mammogram at detecting truly abnormal areas.    COLONOSCOPY:  Colonoscopy to screen for colon cancer is recommended for all women at age 50.  We know, you hate the idea of the prep.  We agree, BUT, having colon cancer and not knowing it is worse!!  Colon cancer so often starts as a polyp that can be seen and removed at colonscopy, which can quite literally save your life!  And if your first colonoscopy is normal and you have no family history of colon cancer, most women don't have to have it again for 10 years.  Once every ten years, you can do something that may end up saving your life, right?  We will be happy to help you get it scheduled when you are ready.    Be sure to check your insurance coverage so you understand how much it will cost.  It may be covered as a preventative service at no cost, but you should check your particular policy.      Breast Self-Awareness Breast self-awareness means being familiar with how your breasts look and feel. It involves checking your breasts regularly and reporting any changes to your health care provider. Practicing breast self-awareness is important. A change in your breasts can be a sign of a serious medical problem. Being familiar with how your breasts look and feel allows  you to find any problems early, when treatment is more likely to be successful. All women should practice breast self-awareness, including women who have had breast implants. How to do a breast self-exam One way to learn what is normal for your breasts and whether your breasts are changing is to do a breast self-exam. To do a breast self-exam: Look for Changes  1. Remove all the clothing above your waist. 2. Stand in front of a mirror in a room with good lighting. 3. Put your hands on your hips. 4. Push your hands firmly downward. 5. Compare your breasts in the mirror. Look for differences between them (asymmetry), such as: ? Differences in shape. ? Differences in size. ? Puckers, dips, and bumps in one breast and not the other. 6. Look at each breast for changes in your skin, such as: ? Redness. ? Scaly areas. 7. Look for changes in your nipples, such as: ? Discharge. ? Bleeding. ? Dimpling. ? Redness. ? A change in position. Feel for Changes  Carefully feel your breasts for lumps and changes. It is best to do this while lying on your back on the floor and again while sitting or standing in the shower or tub with soapy water on your skin. Feel each breast in the following way:  Place the arm on the side of the breast you are examining above your head.  Feel your breast with the other hand.  Start in the nipple area and make  inch (2 cm) overlapping circles to feel your breast. Use the pads of your three middle fingers to do this. Apply light pressure, then medium pressure, then firm pressure. The light pressure will allow you to feel the tissue closest to the skin. The medium pressure will allow you to feel the tissue that is a little deeper. The firm pressure will allow you to feel the tissue close to the ribs.  Continue the overlapping circles, moving downward over the breast until you feel your ribs below your breast.  Move one finger-width toward the center of the body.  Continue to use the  inch (2 cm) overlapping circles to feel your breast as you move slowly up toward your collarbone.  Continue the up and down exam using all three pressures until you reach your armpit.  Write Down What You Find  Write down what is normal for each breast and any changes that you find. Keep a written record with breast changes or normal findings for each breast. By writing this information down, you do not need to depend only on memory for size, tenderness, or location. Write down where you are in your menstrual cycle, if you are still menstruating. If you are having trouble noticing differences in your breasts, do not get discouraged. With time you will become more familiar with the variations in your breasts and more comfortable with the exam. How often should I examine my breasts? Examine   your breasts every month. If you are breastfeeding, the best time to examine your breasts is after a feeding or after using a breast pump. If you menstruate, the best time to examine your breasts is 5-7 days after your period is over. During your period, your breasts are lumpier, and it may be more difficult to notice changes. When should I see my health care provider? See your health care provider if you notice:  A change in shape or size of your breasts or nipples.  A change in the skin of your breast or nipples, such as a reddened or scaly area.  Unusual discharge from your nipples.  A lump or thick area that was not there before.  Pain in your breasts.  Anything that concerns you.  This information is not intended to replace advice given to you by your health care provider. Make sure you discuss any questions you have with your health care provider. Document Released: 07/10/2005 Document Revised: 12/16/2015 Document Reviewed: 05/30/2015 Elsevier Interactive Patient Education  2018 Elsevier Inc.  

## 2018-04-25 NOTE — Addendum Note (Signed)
Addended by: Tobi Bastos on: 04/25/2018 05:36 PM   Modules accepted: Orders

## 2018-04-25 NOTE — Progress Notes (Signed)
52 y.o. [id number] Married Black or [place] American Not Hispanic or Latino female here for annual exam.  Sexually active, no pain.  Period Cycle (Days): 28 Period Duration (Days): 4 days Period Pattern: Regular Menstrual Flow: Light Menstrual Control: Thin pad Menstrual Control Change Freq (Hours): changes pad every 8 hours Dysmenorrhea: None   Recent blood work with anemia, also anemic last year. Was given iron, didn't finish her iron.   Patient's last menstrual period was 04/02/2018 (exact date).          Sexually active: Yes.    The current method of family planning is none.    Exercising: Yes.    walking Smoker:  no  Health Maintenance: Pap:  2012 normal per patient History of abnormal Pap:  no MMG:  2016 normal per patient TDaP:  2018 per patient Colonoscopy: will schedule    reports that she has never smoked. She has never used smokeless tobacco. She reports that she drinks alcohol. She reports that she does not use drugs. No ETOH. Going to school for English for a second language, also taking accounting. Boys 9, 13, 15, 18. Oldest is going to [employer], for [employer].   History reviewed. No pertinent past medical history.  History reviewed. No pertinent surgical history.  Current Outpatient Medications  Medication Sig Dispense Refill  . hydrOXYzine (ATARAX/VISTARIL) 25 MG tablet Take 0.5-1 tablets (12.5-25 mg total) by mouth every 8 (eight) hours as needed for itching. 30 tablet 5  . triamcinolone cream (KENALOG) 0.1 % Apply 1 application topically 2 (two) times daily. 30 g 1   No current facility-administered medications for this visit.     Family History  Problem Relation Age of Onset  . Hypertension Mother   . Hypertension Father     Review of Systems  Constitutional: Negative.   HENT: Negative.   Eyes: Negative.   Respiratory: Negative.   Cardiovascular: Negative.   Gastrointestinal: Negative.   Endocrine: Negative.   Genitourinary: Negative.    Musculoskeletal: Negative.   Skin: Negative.   Allergic/Immunologic: Negative.   Neurological: Negative.   Hematological: Negative.   Psychiatric/Behavioral: Negative.     Exam:   BP 140/82 (BP Location: Right Arm, Patient Position: Sitting, Cuff Size: Normal)   Pulse 100   Ht 5\' 4"  (1.626 m)   Wt 185 lb 6.4 oz (84.1 kg)   LMP 04/02/2018 (Exact Date)   BMI 31.82 kg/m   Weight change: @WEIGHTCHANGE @ Height:   Height: 5\' 4"  (162.6 cm)  Ht Readings from Last 3 Encounters:  04/25/18 5\' 4"  (1.626 m)  04/05/18 5\' 4"  (1.626 m)  01/27/17 5\' 4"  (1.626 m)    General appearance: alert, cooperative and appears stated age Head: Normocephalic, without obvious abnormality, atraumatic Neck: no adenopathy, supple, symmetrical, trachea midline and thyroid normal to inspection and palpation Lungs: clear to auscultation bilaterally Cardiovascular: regular rate and rhythm Breasts: normal appearance, no masses or tenderness Abdomen: soft, non-tender; non distended,  no masses,  no organomegaly Extremities: extremities normal, atraumatic, no cyanosis or edema Skin: Skin color, texture, turgor normal. No rashes or lesions Lymph nodes: Cervical, supraclavicular, and axillary nodes normal. No abnormal inguinal nodes palpated Neurologic: Grossly normal   Pelvic: External genitalia:  no lesions              Urethra:  normal appearing urethra with no masses, tenderness or lesions              Bartholins and Skenes: normal  Vagina: normal appearing vagina with normal color and discharge, no lesions              Cervix: no lesions               Bimanual Exam:  Uterus:  normal size, contour, position, consistency, mobility, non-tender              Adnexa: no mass, fullness, tenderness               Rectovaginal: Confirms               Anus:  normal sphincter tone, no lesions  Chaperone was present for exam.  A:  Well Woman with normal exam  H/O iron def anemia, light  cycles  Elevated BP without diagnosis of HTN  P:   Pap with hpv  She will schedule colonoscopy and mammogram  She is on iron, will f/u with primary  Discussed breast self exam  Discussed calcium and vit D intake  Labs with primary  Recheck BP, if still elevated will send back to primary

## 2018-04-29 LAB — CYTOLOGY - PAP
Diagnosis: NEGATIVE
HPV: NOT DETECTED

## 2018-05-28 ENCOUNTER — Ambulatory Visit
Admission: RE | Admit: 2018-05-28 | Discharge: 2018-05-28 | Disposition: A | Discharge status: Home / Self Care | Payer: BLUE CROSS/BLUE SHIELD | Source: Ambulatory Visit | Attending: Urgent Care | Admitting: Urgent Care

## 2018-05-28 DIAGNOSIS — Z1231 Encounter for screening mammogram for malignant neoplasm of breast: Secondary | ICD-10-CM | POA: Diagnosis not present

## 2018-05-28 DIAGNOSIS — Z1239 Encounter for other screening for malignant neoplasm of breast: Secondary | ICD-10-CM

## 2018-05-29 ENCOUNTER — Encounter: Payer: Self-pay | Admitting: Urgent Care

## 2019-07-09 IMAGING — MG DIGITAL SCREENING BILATERAL MAMMOGRAM WITH TOMO AND CAD
8 series · 8 of 24 positions shown · non-contrast
Comparison: Previous exam(s).

CLINICAL DATA: Screening.

EXAM:
DIGITAL SCREENING BILATERAL MAMMOGRAM WITH TOMO AND CAD

[L CC synth-2D]
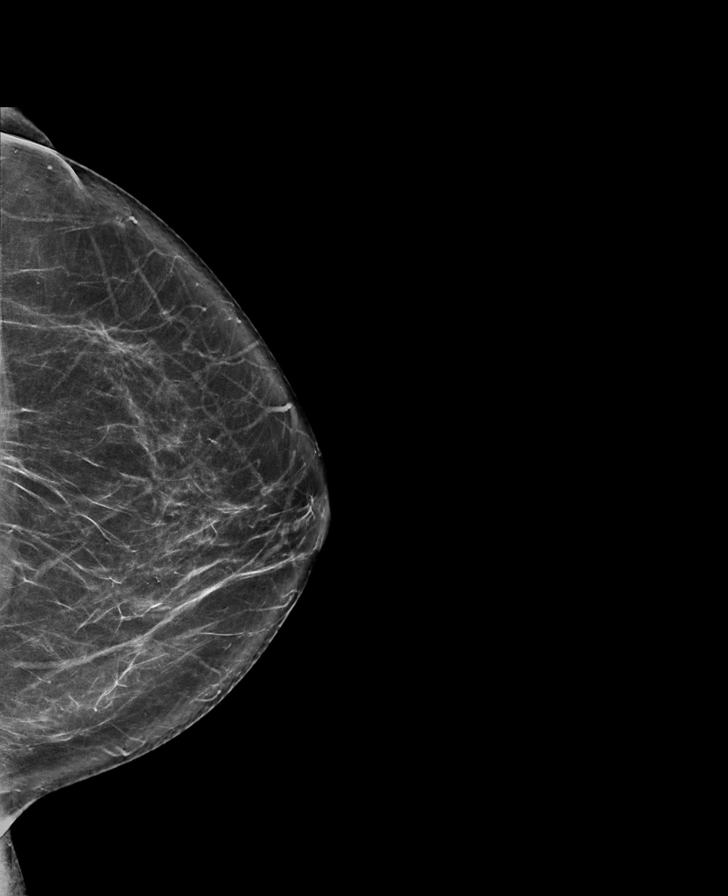

[R CC synth-2D]
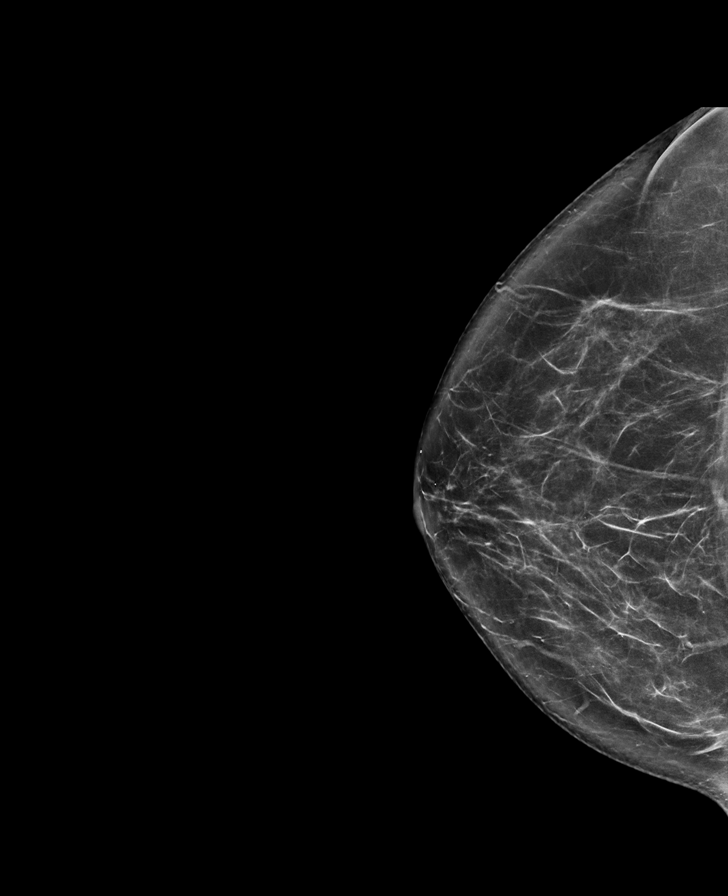

[L MLO synth-2D]
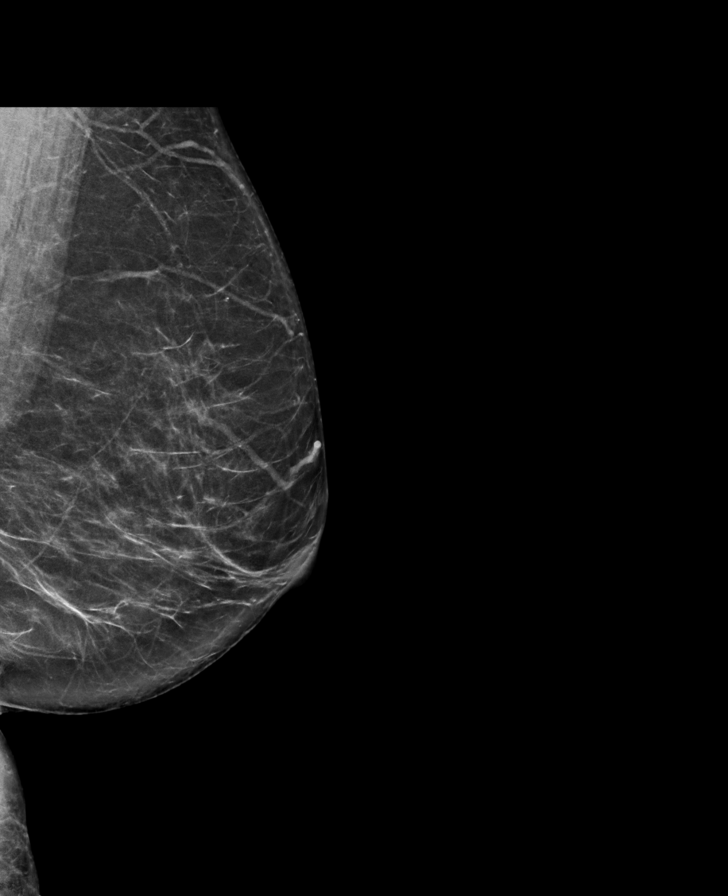

[R MLO synth-2D]
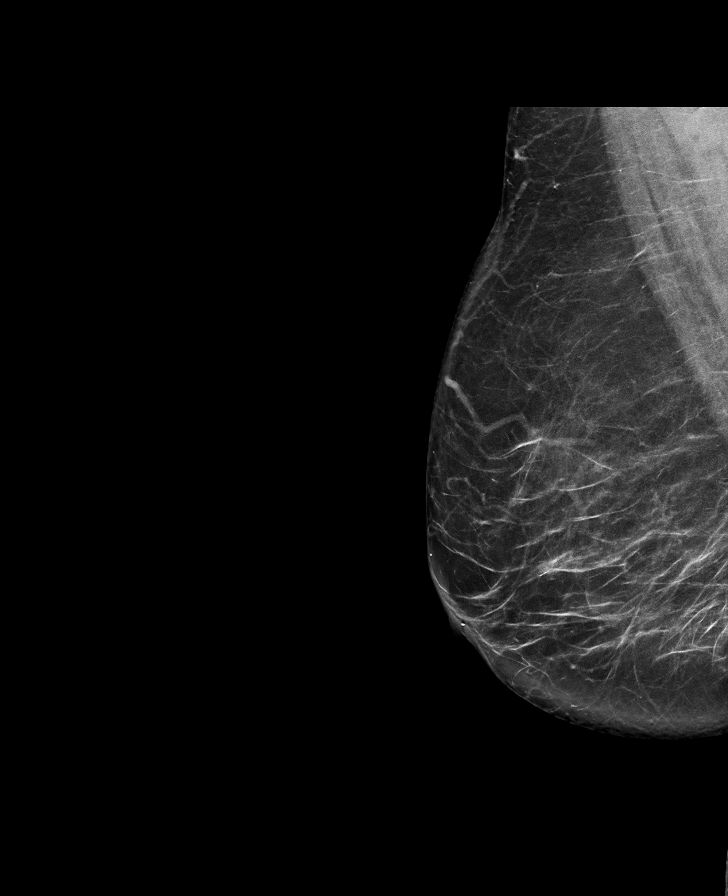

[L CC tomo · tomo slice 39/77.0]
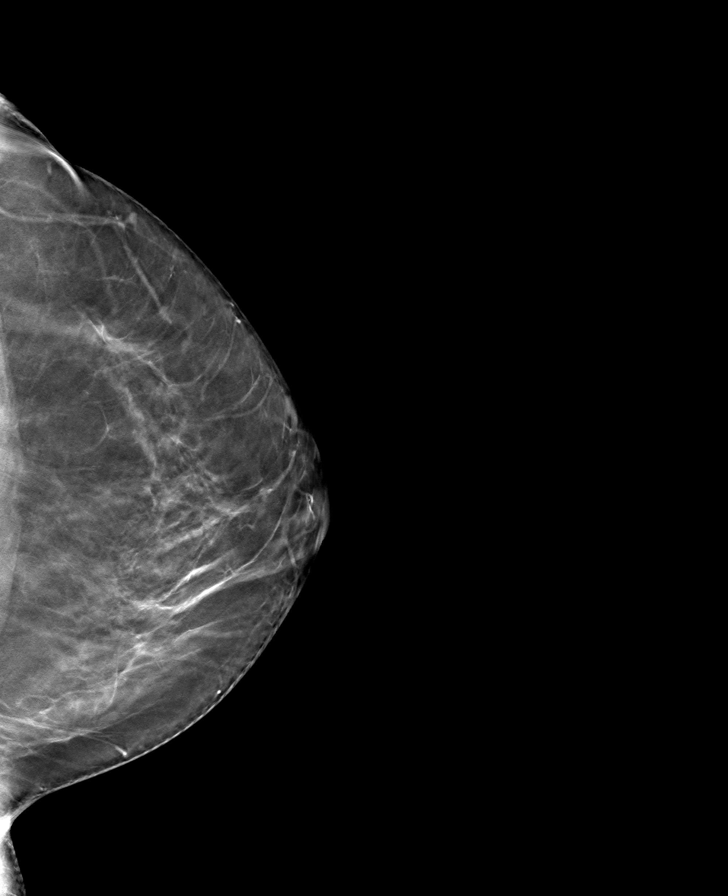

[R MLO tomo · tomo slice 41/81.0]
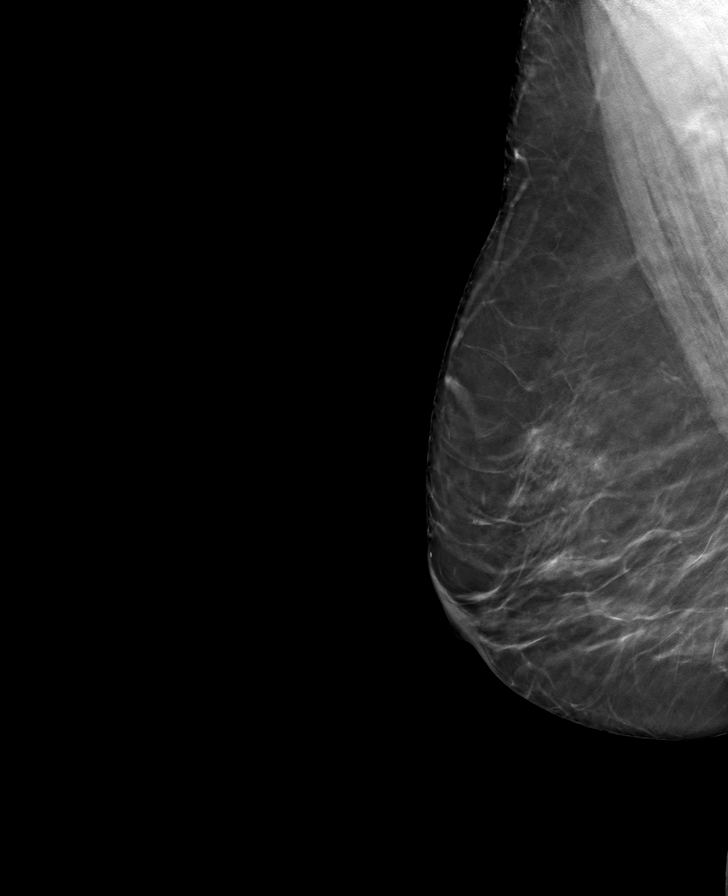

[R CC tomo · tomo slice 39/78.0]
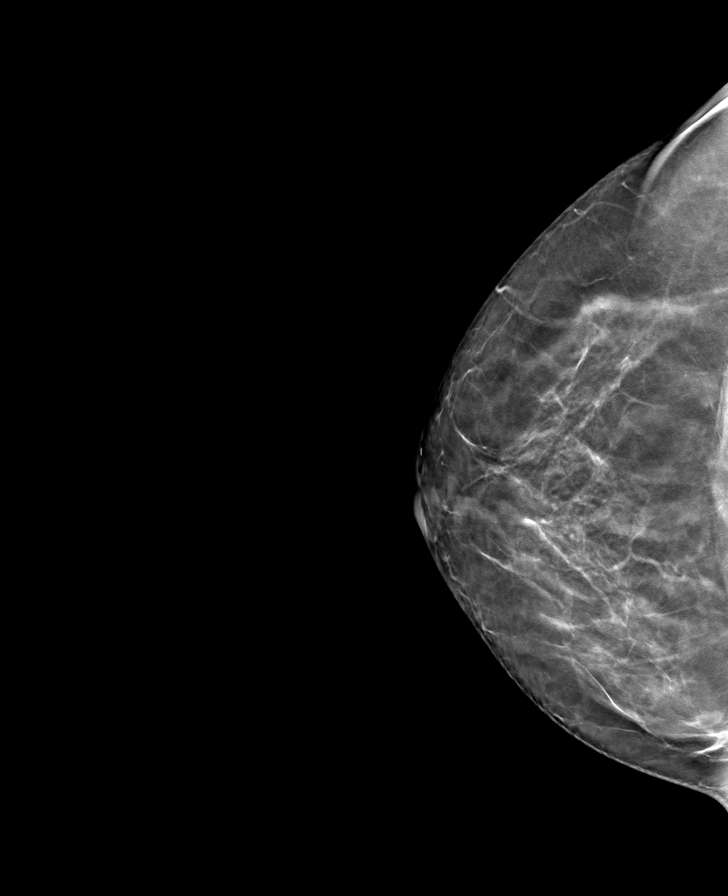

[L MLO tomo · tomo slice 37/73.0]
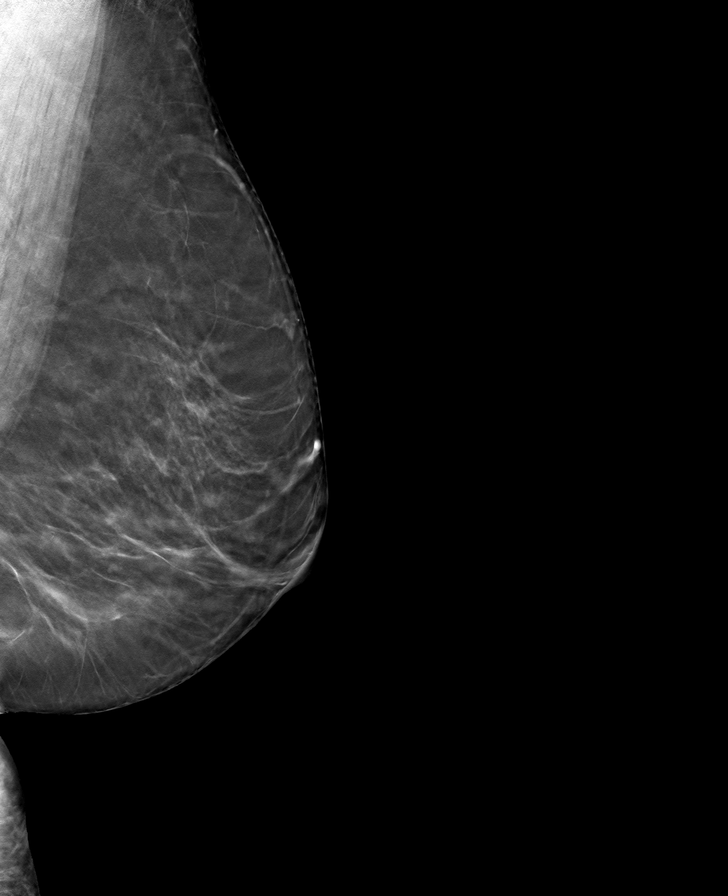

[8 of 24 positions shown; findings below may reference images not displayed]

ACR Breast Density Category b: There are scattered areas of
fibroglandular density.
FINDINGS: There are no findings suspicious for malignancy. Images were
processed with CAD.
IMPRESSION: No mammographic evidence of malignancy. A result letter of this
screening mammogram will be mailed directly to the patient.

RECOMMENDATION:
Screening mammogram in one year. (Code:[id number])

BI-RADS CATEGORY  1: Negative.

## 2019-12-03 ENCOUNTER — Other Ambulatory Visit: Payer: Self-pay

## 2019-12-03 ENCOUNTER — Ambulatory Visit: Payer: BLUE CROSS/BLUE SHIELD | Admitting: Registered Nurse

## 2021-04-04 ENCOUNTER — Ambulatory Visit (INDEPENDENT_AMBULATORY_CARE_PROVIDER_SITE_OTHER): Payer: Self-pay | Admitting: Nurse Practitioner

## 2021-04-04 ENCOUNTER — Other Ambulatory Visit: Payer: Self-pay

## 2021-04-04 ENCOUNTER — Encounter: Payer: Self-pay | Admitting: Nurse Practitioner

## 2021-04-04 VITALS — BP 141/82 | HR 73 | Temp 98.3°F | Ht 65.0 in | Wt 189.0 lb

## 2021-04-04 DIAGNOSIS — Z130 Encounter for screening for diseases of the blood and blood-forming organs and certain disorders involving the immune mechanism: Secondary | ICD-10-CM

## 2021-04-04 DIAGNOSIS — Z13220 Encounter for screening for lipoid disorders: Secondary | ICD-10-CM

## 2021-04-04 DIAGNOSIS — Z1159 Encounter for screening for other viral diseases: Secondary | ICD-10-CM

## 2021-04-04 DIAGNOSIS — R21 Rash and other nonspecific skin eruption: Secondary | ICD-10-CM

## 2021-04-04 DIAGNOSIS — Z114 Encounter for screening for human immunodeficiency virus [HIV]: Secondary | ICD-10-CM

## 2021-04-04 DIAGNOSIS — Z7689 Persons encountering health services in other specified circumstances: Secondary | ICD-10-CM

## 2021-04-04 LAB — POCT URINALYSIS DIP (CLINITEK)
Bilirubin, UA: NEGATIVE
Blood, UA: NEGATIVE
Glucose, UA: NEGATIVE mg/dL
Ketones, POC UA: NEGATIVE mg/dL
Nitrite, UA: NEGATIVE
POC PROTEIN,UA: NEGATIVE
Spec Grav, UA: >=1.03 — AB (ref 1.010–1.025)
Urobilinogen, UA: 0.2 E.U./dL
pH, UA: 5.5 (ref 5.0–8.0)

## 2021-04-04 MED ORDER — HYDROXYZINE HCL 25 MG PO TABS: 12.5000 mg | ORAL_TABLET | Freq: Three times a day (TID) | ORAL | 5 refills | Status: AC | PRN

## 2021-04-04 MED ORDER — TRIAMCINOLONE ACETONIDE 0.1 % EX CREA: 1.0000 "application " | TOPICAL_CREAM | Freq: Two times a day (BID) | CUTANEOUS | 1 refills | Status: DC

## 2021-04-04 NOTE — Progress Notes (Signed)
[facility] [address] [place], [place]  [address] Phone:  [phone]   Fax:  [phone]   New Patient Office Visit  Subjective:  Patient ID: Cheyenne Hubbard, female    DOB: 22-Feb-1966  Age: 55 y.o. MRN: [id number]  CC:  Chief Complaint  Patient presents with   Establish Care    Pt is here today to establish care. Pt states that she has been having trouble with her skin. Pt states that she saw a dermatologist back in 2018 for skin issues. Pt has a large dark reddish black spot on her right shin. Pt states it is very itchy but no pain.    HPI Dominic Rhome [facility] presents to establish care. She  has no past medical history on file.   She works as Equities trader.  She denies any new concerns. She reprots that she was seeing a dermatology for an ongoing rash.  She reports that when she was no longer able to use the cream the rash came back.. She is using OTC which is somewhat helpful.  The rash started on her legs now is on her back and her breast.  She reports that her first mammogram was completed due to the rash on her breasts.  Her mammogram was negative at that time.She has not had a mammogram or Pap test since 2019.  At the end of the visit she did report that her menstrual cycles have changed.  Her last cycle was July 2022.  She reports this is never been a problem for her but she has noticed a change in the last few months.  History reviewed. No pertinent past medical history.  History reviewed. No pertinent surgical history.  Family History  Problem Relation Age of Onset   Hypertension Mother    Hypertension Father    Breast cancer Neg Hx     Social History   Socioeconomic History   Marital status: Married    Spouse name: Not on file   Number of children: Not on file   Years of education: Not on file   Highest education level: Not on file  Occupational History   Not on file  Tobacco Use   Smoking status: Never   Smokeless tobacco: Never   Vaping Use   Vaping Use: Never used  Substance and Sexual Activity   Alcohol use: Not Currently    Comment: occ   Drug use: No   Sexual activity: Yes    Birth control/protection: None  Other Topics Concern   Not on file  Social History Narrative   Not on file   Social Determinants of Health   Financial Resource Strain: Not on file  Food Insecurity: Not on file  Transportation Needs: Not on file  Physical Activity: Not on file  Stress: Not on file  Social Connections: Not on file  Intimate Partner Violence: Not on file    ROS Review of Systems  Constitutional: Negative.   HENT: Negative.    Eyes: Negative.   Respiratory: Negative.    Cardiovascular: Negative.   Skin:  Positive for rash (right lower leg thick leathery and left shin).   Objective:   Today's Vitals: BP (!) 141/82   Pulse 73   Temp 98.3 F (36.8 C)   Ht 5\' 5"  (1.651 m)   Wt 189 lb (85.7 kg)   SpO2 100%   BMI 31.45 kg/m   Physical Exam Constitutional:      Appearance: She is obese.  HENT:     Head: Normocephalic and atraumatic.     Right Ear: Tympanic membrane normal.     Left Ear: Tympanic membrane normal.     Nose: Nose normal.     Mouth/Throat:     Mouth: Mucous membranes are moist.  Neck:     Vascular: No carotid bruit.  Cardiovascular:     Rate and Rhythm: Normal rate and regular rhythm.     Pulses: Normal pulses.     Heart sounds: Normal heart sounds.  Pulmonary:     Effort: Pulmonary effort is normal.     Breath sounds: Normal breath sounds.  Abdominal:     General: Bowel sounds are normal.     Palpations: Abdomen is soft.     Comments: hypoactive  Musculoskeletal:        General: Normal range of motion.     Cervical back: Normal range of motion. No rigidity or tenderness.  Lymphadenopathy:     Cervical: No cervical adenopathy.  Skin:    Capillary Refill: Capillary refill takes less than 2 seconds.     Findings: Erythema present.     Comments: Erythema with raised rash  and scaling to lower back Flat erythematous rash to breast and underneath breast Dark thick raised rash to right lower calf and left shin  Neurological:     General: No focal deficit present.     Mental Status: She is alert and oriented to person, place, and time.  Psychiatric:        Mood and Affect: Mood normal.        Behavior: Behavior normal.        Thought Content: Thought content normal.        Judgment: Judgment normal.    Assessment & Plan:   Problem List Items Addressed This Visit   None Visit Diagnoses     Establishing care with new doctor, encounter for    -  Primary Discussed female health maintenance; SBE, annual CBE, PAP test Discussed general safety in vehicle and COVID Discussed regular hydration with water Discussed healthy diet and exercise and weight management Discussed sexual health  Discussed mental health Encouraged to call our office for an appointment with in ongoing concerns for questions.     Relevant Orders   POCT URINALYSIS DIP (CLINITEK) (Completed)   Comp. Metabolic Panel (12)   Encounter for hepatitis C screening test for low risk patient       Relevant Orders   Hepatitis C antibody   Screening for deficiency anemia       Relevant Orders   CBC with Differential/Platelet   Screening for cholesterol level       Relevant Orders   Lipid panel   Screening for HIV (human immunodeficiency virus)       Relevant Orders   HIV Antibody (routine testing w rflx)   Rash     Persistent Cultures pending Refill triamcinolone and hydroxyzine Will consider oral steroid based on culture results   Relevant Orders   Culture, fungus without smear   WOUND CULTURE       Outpatient Encounter Medications as of 04/04/2021  Medication Sig   hydrOXYzine (ATARAX/VISTARIL) 25 MG tablet Take 0.5-1 tablets (12.5-25 mg total) by mouth every 8 (eight) hours as needed for itching.   triamcinolone cream (KENALOG) 0.1 % Apply 1 application topically 2 (two) times  daily.   [DISCONTINUED] hydrOXYzine (ATARAX/VISTARIL) 25 MG tablet Take 0.5-1 tablets (12.5-25 mg total) by mouth every 8 (eight) hours  as needed for itching. (Patient not taking: Reported on 04/04/2021)   [DISCONTINUED] triamcinolone cream (KENALOG) 0.1 % Apply 1 application topically 2 (two) times daily. (Patient not taking: Reported on 04/04/2021)   No facility-administered encounter medications on file as of 04/04/2021.    Follow-up: Return in about 6 weeks (around 05/16/2021) for Physcial VISIT,EST,40-64 [99396].   Barbette Merino, NP

## 2021-04-04 NOTE — Patient Instructions (Addendum)
Health Maintenance, Female Adopting a healthy lifestyle and getting preventive care are important in promoting health and wellness. Ask your health care provider about: The right schedule for you to have regular tests and exams. Things you can do on your own to prevent diseases and keep yourself healthy. What should I know about diet, weight, and exercise? Eat a healthy diet  Eat a diet that includes plenty of vegetables, fruits, low-fat dairy products, and lean protein. Do not eat a lot of foods that are high in solid fats, added sugars, or sodium. Maintain a healthy weight Body mass index (BMI) is used to identify weight problems. It estimates body fat based on height and weight. Your health care provider can help determine your BMI and help you achieve or maintain a healthy weight. Get regular exercise Get regular exercise. This is one of the most important things you can do for your health. Most adults should: Exercise for at least 150 minutes each week. The exercise should increase your heart rate and make you sweat (moderate-intensity exercise). Do strengthening exercises at least twice a week. This is in addition to the moderate-intensity exercise. Spend less time sitting. Even light physical activity can be beneficial. Watch cholesterol and blood lipids Have your blood tested for lipids and cholesterol at 53 years of age, then have this test every 5 years. Have your cholesterol levels checked more often if: Your lipid or cholesterol levels are high. You are older than 53 years of age. You are at high risk for heart disease. What should I know about cancer screening? Depending on your health history and family history, you may need to have cancer screening at various ages. This may include screening for: Breast cancer. Cervical cancer. Colorectal cancer. Skin cancer. Lung cancer. What should I know about heart disease, diabetes, and high blood pressure? Blood pressure and heart  disease High blood pressure causes heart disease and increases the risk of stroke. This is more likely to develop in people who have high blood pressure readings, are of African descent, or are overweight. Have your blood pressure checked: Every 3-5 years if you are 18-39 years of age. Every year if you are 40 years old or older. Diabetes Have regular diabetes screenings. This checks your fasting blood sugar level. Have the screening done: Once every three years after age 40 if you are at a normal weight and have a low risk for diabetes. More often and at a younger age if you are overweight or have a high risk for diabetes. What should I know about preventing infection? Hepatitis B If you have a higher risk for hepatitis B, you should be screened for this virus. Talk with your health care provider to find out if you are at risk for hepatitis B infection. Hepatitis C Testing is recommended for: Everyone born from 1945 through 1965. Anyone with known risk factors for hepatitis C. Sexually transmitted infections (STIs) Get screened for STIs, including gonorrhea and chlamydia, if: You are sexually active and are younger than 53 years of age. You are older than 53 years of age and your health care provider tells you that you are at risk for this type of infection. Your sexual activity has changed since you were last screened, and you are at increased risk for chlamydia or gonorrhea. Ask your health care provider if you are at risk. Ask your health care provider about whether you are at high risk for HIV. Your health care provider may recommend a prescription medicine   to help prevent HIV infection. If you choose to take medicine to prevent HIV, you should first get tested for HIV. You should then be tested every 3 months for as long as you are taking the medicine. Pregnancy If you are about to stop having your period (premenopausal) and you may become pregnant, seek counseling before you get  pregnant. Take 400 to 800 micrograms (mcg) of folic acid every day if you become pregnant. Ask for birth control (contraception) if you want to prevent pregnancy. Osteoporosis and menopause Osteoporosis is a disease in which the bones lose minerals and strength with aging. This can result in bone fractures. If you are 65 years old or older, or if you are at risk for osteoporosis and fractures, ask your health care provider if you should: Be screened for bone loss. Take a calcium or vitamin D supplement to lower your risk of fractures. Be given hormone replacement therapy (HRT) to treat symptoms of menopause. Follow these instructions at home: Lifestyle Do not use any products that contain nicotine or tobacco, such as cigarettes, e-cigarettes, and chewing tobacco. If you need help quitting, ask your health care provider. Do not use street drugs. Do not share needles. Ask your health care provider for help if you need support or information about quitting drugs. Alcohol use Do not drink alcohol if: Your health care provider tells you not to drink. You are pregnant, may be pregnant, or are planning to become pregnant. If you drink alcohol: Limit how much you use to 0-1 drink a day. Limit intake if you are breastfeeding. Be aware of how much alcohol is in your drink. In the [place] one drink equals one 12 oz bottle of beer (355 mL), one 5 oz glass of wine (148 mL), or one 1 oz glass of hard liquor (44 mL). General instructions Schedule regular health, dental, and eye exams. Stay current with your vaccines. Tell your health care provider if: You often feel depressed. You have ever been abused or do not feel safe at home. Summary Adopting a healthy lifestyle and getting preventive care are important in promoting health and wellness. Follow your health care provider's instructions about healthy diet, exercising, and getting tested or screened for diseases. Follow your health care provider's  instructions on monitoring your cholesterol and blood pressure. This information is not intended to replace advice given to you by your health care provider. Make sure you discuss any questions you have with your health care provider. Document Revised: 09/17/2020 Document Reviewed: 07/03/2018 Elsevier Patient Education  2022 Elsevier Inc.  Breast Self-Awareness Breast self-awareness is knowing how your breasts look and feel. Doing breast self-awareness is important. It allows you to catch a breast problem early while it is still small and can be treated. All women should do breast self-awareness, including women who have had breast implants. Tell your doctor if you notice a change in your breasts. What you need: A mirror. A well-lit room. How to do a breast self-exam A breast self-exam is one way to learn what is normal for your breasts and to check for changes. To do a breast self-exam: Look for changes  Take off all the clothes above your waist. Stand in front of a mirror in a room with good lighting. Put your hands on your hips. Push your hands down. Look at your breasts and nipples in the mirror to see if one breast or nipple looks different from the other. Check to see if: The shape of one breast   is different. The size of one breast is different. There are wrinkles, dips, and bumps in one breast and not the other. Look at each breast for changes in the skin, such as: Redness. Scaly areas. Look for changes in your nipples, such as: Liquid around the nipples. Bleeding. Dimpling. Redness. A change in where the nipples are. Feel for changes  Lie on your back on the floor. Feel each breast. To do this, follow these steps: Pick a breast to feel. Put the arm closest to that breast above your head. Use your other arm to feel the nipple area of your breast. Feel the area with the pads of your three middle fingers by making small circles with your fingers. For the first circle, press  lightly. For the second circle, press harder. For the third circle, press even harder. Keep making circles with your fingers at the different pressures as you move down your breast. Stop when you feel your ribs. Move your fingers a little toward the center of your body. Start making circles with your fingers again, this time going up until you reach your collarbone. Keep making up-and-down circles until you reach your armpit. Remember to keep using the three pressures. Feel the other breast in the same way. Sit or stand in the tub or shower. With soapy water on your skin, feel each breast the same way you did in step 2 when you were lying on the floor. Write down what you find Writing down what you find can help you remember what to tell your doctor. Write down: What is normal for each breast. Any changes you find in each breast, including: The kind of changes you find. Whether you have pain. Size and location of any lumps. When you last had your menstrual period. General tips Check your breasts every month. If you are breastfeeding, the best time to check your breasts is after you feed your baby or after you use a breast pump. If you get menstrual periods, the best time to check your breasts is 5-7 days after your menstrual period is over. With time, you will become comfortable with the self-exam, and you will begin to know if there are changes in your breasts. Contact a doctor if you: See a change in the shape or size of your breasts or nipples. See a change in the skin of your breast or nipples, such as red or scaly skin. Have fluid coming from your nipples that is not normal. Find a lump or thick area that was not there before. Have pain in your breasts. Have any concerns about your breast health. Summary Breast self-awareness includes looking for changes in your breasts, as well as feeling for changes within your breasts. Breast self-awareness should be done in front of a mirror in  a well-lit room. You should check your breasts every month. If you get menstrual periods, the best time to check your breasts is 5-7 days after your menstrual period is over. Let your doctor know of any changes you see in your breasts, including changes in size, changes on the skin, pain or tenderness, or fluid from your nipples that is not normal. This information is not intended to replace advice given to you by your health care provider. Make sure you discuss any questions you have with your health care provider. Document Revised: 02/26/2018 Document Reviewed: 02/26/2018 Elsevier Patient Education  2022 [employer].

## 2021-04-05 LAB — COMP. METABOLIC PANEL (12)
AST: 16 IU/L (ref 0–40)
Albumin/Globulin Ratio: 1.4 (ref 1.2–2.2)
Albumin: 4.3 g/dL (ref 3.8–4.9)
Alkaline Phosphatase: 59 IU/L (ref 44–121)
BUN/Creatinine Ratio: 15 (ref 9–23)
BUN: 10 mg/dL (ref 6–24)
Bilirubin Total: 0.3 mg/dL (ref 0.0–1.2)
Calcium: 9.5 mg/dL (ref 8.7–10.2)
Chloride: 105 mmol/L (ref 96–106)
Creatinine, Ser: 0.68 mg/dL (ref 0.57–1.00)
Globulin, Total: 3 g/dL (ref 1.5–4.5)
Glucose: 92 mg/dL (ref 65–99)
Potassium: 4.2 mmol/L (ref 3.5–5.2)
Sodium: 139 mmol/L (ref 134–144)
Total Protein: 7.3 g/dL (ref 6.0–8.5)
eGFR: 104 mL/min/{1.73_m2} (ref >59)

## 2021-04-05 LAB — CBC WITH DIFFERENTIAL/PLATELET
Basophils Absolute: 0 10*3/uL (ref 0.0–0.2)
Basos: 1 %
EOS (ABSOLUTE): 0.1 10*3/uL (ref 0.0–0.4)
Eos: 1 %
Hematocrit: 38.1 % (ref 34.0–46.6)
Hemoglobin: 11 g/dL — ABNORMAL LOW (ref 11.1–15.9)
Immature Grans (Abs): 0 10*3/uL (ref 0.0–0.1)
Immature Granulocytes: 0 %
Lymphocytes Absolute: 1.6 10*3/uL (ref 0.7–3.1)
Lymphs: 35 %
MCH: 21.6 pg — ABNORMAL LOW (ref 26.6–33.0)
MCHC: 28.9 g/dL — ABNORMAL LOW (ref 31.5–35.7)
MCV: 75 fL — ABNORMAL LOW (ref 79–97)
Monocytes Absolute: 0.3 10*3/uL (ref 0.1–0.9)
Monocytes: 6 %
Neutrophils Absolute: 2.6 10*3/uL (ref 1.4–7.0)
Neutrophils: 57 %
Platelets: 309 10*3/uL (ref 150–450)
RBC: 5.1 x10E6/uL (ref 3.77–5.28)
RDW: 15.6 % — ABNORMAL HIGH (ref 11.7–15.4)
WBC: 4.6 10*3/uL (ref 3.4–10.8)

## 2021-04-05 LAB — LIPID PANEL
Chol/HDL Ratio: 2.5 ratio (ref 0.0–4.4)
Cholesterol, Total: 146 mg/dL (ref 100–199)
HDL: 59 mg/dL (ref >39)
LDL Chol Calc (NIH): 78 mg/dL (ref 0–99)
Triglycerides: 40 mg/dL (ref 0–149)
VLDL Cholesterol Cal: 9 mg/dL (ref 5–40)

## 2021-04-05 LAB — HEPATITIS C ANTIBODY: Hep C Virus Ab: <0.1 s/co ratio (ref 0.0–0.9)

## 2021-04-05 LAB — HIV ANTIBODY (ROUTINE TESTING W REFLEX): HIV Screen 4th Generation wRfx: NONREACTIVE

## 2021-04-10 LAB — WOUND CULTURE: Organism ID, Bacteria: NONE SEEN

## 2021-04-11 ENCOUNTER — Other Ambulatory Visit: Payer: Self-pay | Admitting: Nurse Practitioner

## 2021-04-11 DIAGNOSIS — R21 Rash and other nonspecific skin eruption: Secondary | ICD-10-CM

## 2021-04-11 NOTE — Progress Notes (Signed)
al  [facility] Patient [facility] [address] [place], [place]  [address] Phone:  [phone]   Fax:  [phone]

## 2021-05-16 ENCOUNTER — Ambulatory Visit (INDEPENDENT_AMBULATORY_CARE_PROVIDER_SITE_OTHER): Payer: Self-pay | Admitting: Nurse Practitioner

## 2021-05-16 ENCOUNTER — Other Ambulatory Visit: Payer: Self-pay

## 2021-05-16 ENCOUNTER — Ambulatory Visit: Payer: Self-pay | Admitting: Nurse Practitioner

## 2021-05-16 ENCOUNTER — Encounter: Payer: Self-pay | Admitting: Nurse Practitioner

## 2021-05-16 VITALS — BP 128/80 | HR 69 | Temp 97.2°F | Ht 65.0 in | Wt 186.0 lb

## 2021-05-16 DIAGNOSIS — Z01419 Encounter for gynecological examination (general) (routine) without abnormal findings: Secondary | ICD-10-CM

## 2021-05-16 DIAGNOSIS — R21 Rash and other nonspecific skin eruption: Secondary | ICD-10-CM

## 2021-05-16 MED ORDER — TRIAMCINOLONE ACETONIDE 0.1 % EX CREA: 1.0000 "application " | TOPICAL_CREAM | Freq: Two times a day (BID) | CUTANEOUS | 1 refills | Status: DC

## 2021-05-16 NOTE — Patient Instructions (Signed)
You were seen today in the [facility] for women's annual exam and medication refill. Labs were collected, results will be available via MyChart or, if abnormal, you will be contacted by clinic staff. You were prescribed medications, please take as directed. Please follow up in 6 mths for reevaluation.

## 2021-05-16 NOTE — Progress Notes (Signed)
[place], [place]  [address] Phone:  [phone]   Fax:  [phone] Subjective:   Patient ID: Cheyenne Hubbard, female    DOB: 1966/03/16, 55 y.o.   MRN: [id number]  Chief Complaint  Patient presents with   Gynecologic Exam   Gynecologic Exam Associated symptoms include rash. Pertinent negatives include no abdominal pain, chills, constipation, diarrhea, fever, nausea or vomiting.  Cheyenne Hubbard 55 y.o. female with history of eczema to the [facility] for women's annual wellness exam and medications refill.  Patient denies any changes in menstrual cycle or vaginal symptoms. Endorses 1 partner in past 6 mths, married. Completes self breast exam regularly. Requesting refill of triamcinolone, states that it has been effective in treating eczema.  Denies any other complaints today. Denies any fatigue, chest pain, shortness of breath, HA or dizziness. Denies any blurred vision, numbness or tingling.    No past medical history on file.  No past surgical history on file.  Family History  Problem Relation Age of Onset   Hypertension Mother    Hypertension Father    Breast cancer Neg Hx     Social History   Socioeconomic History   Marital status: Married    Spouse name: Not on file   Number of children: Not on file   Years of education: Not on file   Highest education level: Not on file  Occupational History   Not on file  Tobacco Use   Smoking status: Never   Smokeless tobacco: Never  Vaping Use   Vaping Use: Never used  Substance and Sexual Activity   Alcohol use: Not Currently    Comment: occ   Drug use: No   Sexual activity: Yes    Birth control/protection: None  Other Topics Concern   Not on file  Social History Narrative   Not on file   Social Determinants of Health   Financial Resource Strain: Not on file  Food Insecurity: Not on file  Transportation Needs: Not on file  Physical Activity: Not on file   Stress: Not on file  Social Connections: Not on file  Intimate Partner Violence: Not on file    Outpatient Medications Prior to Visit  Medication Sig Dispense Refill   hydrOXYzine (ATARAX/VISTARIL) 25 MG tablet Take 0.5-1 tablets (12.5-25 mg total) by mouth every 8 (eight) hours as needed for itching. 30 tablet 5   triamcinolone cream (KENALOG) 0.1 % Apply 1 application topically 2 (two) times daily. 30 g 1   No facility-administered medications prior to visit.    Allergies  Allergen Reactions   Quinolones Itching    Review of Systems  Constitutional:  Negative for chills, fever and malaise/fatigue.  HENT: Negative.    Eyes: Negative.   Respiratory:  Negative for cough and shortness of breath.   Cardiovascular:  Negative for chest pain, palpitations and leg swelling.  Gastrointestinal:  Negative for abdominal pain, blood in stool, constipation, diarrhea, nausea and vomiting.  Genitourinary: Negative.   Musculoskeletal: Negative.   Skin:  Positive for rash.  Neurological: Negative.   Psychiatric/Behavioral:  Negative for depression. The patient is not nervous/anxious.   All other systems reviewed and are negative.     Objective:    Physical Exam Vitals reviewed.  Constitutional:      General: She is not in acute distress.    Appearance: Normal appearance.  HENT:     Head: Normocephalic.  Cardiovascular:     Rate and  Rhythm: Normal rate and regular rhythm.     Pulses: Normal pulses.     Heart sounds: Normal heart sounds.     Comments: No obvious peripheral edema Pulmonary:     Effort: Pulmonary effort is normal.     Breath sounds: Normal breath sounds.  Chest:  Breasts:    Right: Normal. No swelling, bleeding, inverted nipple, mass, nipple discharge, skin change or tenderness.     Left: No swelling, bleeding, inverted nipple, mass, nipple discharge, skin change or tenderness.  Genitourinary:    General: Normal vulva.     Vagina: No vaginal discharge.      Comments: Negative CMT Musculoskeletal:        General: Normal range of motion.     Cervical back: Normal range of motion and neck supple.  Skin:    General: Skin is warm and dry.     Capillary Refill: Capillary refill takes less than 2 seconds.     Findings: Rash present.     Comments: Generalized macular papular rash noted, consistent with patient history of eczema  Neurological:     General: No focal deficit present.     Mental Status: She is alert and oriented to person, place, and time.  Psychiatric:        Mood and Affect: Mood normal.        Behavior: Behavior normal.        Thought Content: Thought content normal.        Judgment: Judgment normal.    BP 128/80 (BP Location: Right Arm, Patient Position: Sitting)   Pulse 69   Temp (!) 97.2 F (36.2 C)   Ht 5' 5"  (1.651 m)   Wt 186 lb (84.4 kg)   SpO2 100%   BMI 30.95 kg/m  Wt Readings from Last 3 Encounters:  05/16/21 186 lb (84.4 kg)  04/04/21 189 lb (85.7 kg)  04/25/18 185 lb 6.4 oz (84.1 kg)    Immunization History  Administered Date(s) Administered   Influenza,inj,Quad PF,6+ Mos 05/16/2016, 04/16/2018, 04/08/2019   Td 01/27/2017    Diabetic Foot Exam - Simple   No data filed     Lab Results  Component Value Date   TSH 1.580 04/05/2018   Lab Results  Component Value Date   WBC 4.6 04/04/2021   HGB 11.0 (L) 04/04/2021   HCT 38.1 04/04/2021   MCV 75 (L) 04/04/2021   PLT 309 04/04/2021   Lab Results  Component Value Date   NA 139 04/04/2021   K 4.2 04/04/2021   CO2 23 04/05/2018   GLUCOSE 92 04/04/2021   BUN 10 04/04/2021   CREATININE 0.68 04/04/2021   BILITOT 0.3 04/04/2021   ALKPHOS 59 04/04/2021   AST 16 04/04/2021   ALT 15 04/05/2018   PROT 7.3 04/04/2021   ALBUMIN 4.3 04/04/2021   CALCIUM 9.5 04/04/2021   EGFR 104 04/04/2021   Lab Results  Component Value Date   CHOL 146 04/04/2021   CHOL 123 04/05/2018   Lab Results  Component Value Date   HDL 59 04/04/2021   HDL 50  04/05/2018   Lab Results  Component Value Date   LDLCALC 78 04/04/2021   LDLCALC 66 04/05/2018   Lab Results  Component Value Date   TRIG 40 04/04/2021   TRIG 35 04/05/2018   Lab Results  Component Value Date   CHOLHDL 2.5 04/04/2021   CHOLHDL 2.5 04/05/2018   Lab Results  Component Value Date   HGBA1C 5.2 11/22/2016  Assessment & Plan:   Problem List Items Addressed This Visit   None Visit Diagnoses     Rash    -  Primary   Relevant Medications   triamcinolone cream (KENALOG) 0.1 % Discussed at length non pharmacological methods that may assist in managing eczema    Encounter for well woman exam with routine gynecological exam       Relevant Orders   Pap IG and Chlamydia/Gonococcus, NAA ([facility])   NuSwab BV and Candida, NAA Encouraged continued regular SBE   Follow up in 6 mths for reevaluation of eczema, sooner as needed    I am having Gwenlyn Found maintain her hydrOXYzine and triamcinolone cream.  Meds ordered this encounter  Medications   triamcinolone cream (KENALOG) 0.1 %    Sig: Apply 1 application topically 2 (two) times daily.    Dispense:  453.6 g    Refill:  1     Teena Dunk, NP

## 2021-05-18 ENCOUNTER — Other Ambulatory Visit: Payer: Self-pay | Admitting: Nurse Practitioner

## 2021-05-18 DIAGNOSIS — B9689 Other specified bacterial agents as the cause of diseases classified elsewhere: Secondary | ICD-10-CM

## 2021-05-18 LAB — NUSWAB BV AND CANDIDA, NAA
Atopobium vaginae: HIGH Score — AB
BVAB 2: HIGH Score — AB
Candida albicans, NAA: NEGATIVE
Candida glabrata, NAA: NEGATIVE
Megasphaera 1: HIGH Score — AB

## 2021-05-18 MED ORDER — METRONIDAZOLE 500 MG PO TABS
500.0000 mg | ORAL_TABLET | Freq: Two times a day (BID) | ORAL | 0 refills | Status: AC
Start: 2021-05-18 — End: 2021-05-25

## 2021-05-20 LAB — PAP IG AND CT-NG NAA
Chlamydia, Nuc. Acid Amp: NEGATIVE
Gonococcus by Nucleic Acid Amp: NEGATIVE

## 2021-11-14 ENCOUNTER — Ambulatory Visit: Payer: No Typology Code available for payment source | Admitting: Nurse Practitioner

## 2021-11-14 ENCOUNTER — Encounter: Payer: Self-pay | Admitting: Nurse Practitioner

## 2021-11-14 ENCOUNTER — Ambulatory Visit (INDEPENDENT_AMBULATORY_CARE_PROVIDER_SITE_OTHER): Payer: No Typology Code available for payment source | Admitting: Nurse Practitioner

## 2021-11-14 VITALS — BP 145/89 | HR 88 | Temp 98.4°F | Ht 65.0 in | Wt 186.2 lb

## 2021-11-14 DIAGNOSIS — R21 Rash and other nonspecific skin eruption: Secondary | ICD-10-CM | POA: Diagnosis not present

## 2021-11-14 DIAGNOSIS — L308 Other specified dermatitis: Secondary | ICD-10-CM

## 2021-11-14 DIAGNOSIS — R030 Elevated blood-pressure reading, without diagnosis of hypertension: Secondary | ICD-10-CM | POA: Diagnosis not present

## 2021-11-14 MED ORDER — TRIAMCINOLONE ACETONIDE 0.1 % EX OINT: TOPICAL_OINTMENT | Freq: Two times a day (BID) | CUTANEOUS | 2 refills | Status: AC

## 2021-11-14 NOTE — Progress Notes (Signed)
? ?[place] ?[place], [place]  [address] ?Phone:  [phone]   Fax:  [phone] ?Subjective:  ? Patient ID: Cheyenne Hubbard, female    DOB: 1965-11-12, 56 y.o.   MRN: [id number] ? ?Chief Complaint  ?Patient presents with  ? Follow-up  ?  Pt is here for 6 months follow up. Pt stated she still hs problems with her eczema itching. Pt would like a refill for the kenalog cream  ? ?HPI ?[name] [facility] 56 y.o. female  has no past medical history on file. To the [facility] for reevaluation of eczema. ? ?States that cream is  only somewhat effective, when she uses it still comes and goes intermittently. Also endorses intermittent itching. Requesting oral medication if possible for skin condition. States that she has had for several years.  ? ?When questioned about elevated B/P , states that her B/P at home is typically normal. It is typically 120/80.  ? ?Denies any other concerns today. Denies any fatigue, chest pain, shortness of breath, HA or dizziness. Denies any blurred vision, numbness or tingling. ? ?History reviewed. No pertinent past medical history. ? ?History reviewed. No pertinent surgical history. ? ?Family History  ?Problem Relation Age of Onset  ? Hypertension Mother   ? Hypertension Father   ? Breast cancer Neg Hx   ? ? ?Social History  ? ?Socioeconomic History  ? Marital status: Married  ?  Spouse name: Not on file  ? Number of children: Not on file  ? Years of education: Not on file  ? Highest education level: Not on file  ?Occupational History  ? Not on file  ?Tobacco Use  ? Smoking status: Never  ? Smokeless tobacco: Never  ?Vaping Use  ? Vaping Use: Never used  ?Substance and Sexual Activity  ? Alcohol use: Not Currently  ?  Comment: occ  ? Drug use: No  ? Sexual activity: Yes  ?  Birth control/protection: None  ?Other Topics Concern  ? Not on file  ?Social History Narrative  ? Not on file  ? ?Social Determinants of Health  ? ?Financial Resource Strain:  Not on file  ?Food Insecurity: Not on file  ?Transportation Needs: Not on file  ?Physical Activity: Not on file  ?Stress: Not on file  ?Social Connections: Not on file  ?Intimate Partner Violence: Not on file  ? ? ?Outpatient Medications Prior to Visit  ?Medication Sig Dispense Refill  ? hydrOXYzine (ATARAX/VISTARIL) 25 MG tablet Take 0.5-1 tablets (12.5-25 mg total) by mouth every 8 (eight) hours as needed for itching. 30 tablet 5  ? triamcinolone cream (KENALOG) 0.1 % Apply 1 application topically 2 (two) times daily. 453.6 g 1  ? ?No facility-administered medications prior to visit.  ? ? ?Allergies  ?Allergen Reactions  ? Quinolones Itching  ? ? ?Review of Systems  ?Constitutional:  Negative for chills, fever and malaise/fatigue.  ?Respiratory: Negative.  Negative for cough and shortness of breath.   ?Cardiovascular: Negative.  Negative for chest pain, palpitations and leg swelling.  ?Gastrointestinal:  Negative for abdominal pain, blood in stool, constipation, diarrhea, nausea and vomiting.  ?Skin:  Positive for itching and rash.  ?Neurological: Negative.   ?Psychiatric/Behavioral:  Negative for depression. The patient is not nervous/anxious.   ?All other systems reviewed and are negative. ? ?   ?Objective:  ?  ?Physical Exam ?Constitutional:   ?   General: She is not in acute distress. ?   Appearance:  Normal appearance. She is obese.  ?HENT:  ?   Head: Normocephalic.  ?Cardiovascular:  ?   Rate and Rhythm: Normal rate and regular rhythm.  ?   Pulses: Normal pulses.  ?   Heart sounds: Normal heart sounds.  ?   Comments: No obvious peripheral edema ?Pulmonary:  ?   Effort: Pulmonary effort is normal.  ?   Breath sounds: Normal breath sounds.  ?Skin: ?   General: Skin is warm and dry.  ?   Capillary Refill: Capillary refill takes less than 2 seconds.  ?   Findings: Rash present. Rash is macular and papular.  ? ?    ?Neurological:  ?   Mental Status: She is alert.  ?Psychiatric:     ?   Mood and Affect: Mood  normal.     ?   Behavior: Behavior normal.     ?   Thought Content: Thought content normal.     ?   Judgment: Judgment normal.  ? ?BP (!) 145/89 (BP Location: Right Arm, Patient Position: Sitting, Cuff Size: Normal)   Pulse 88   Temp 98.4 ?F (36.9 ?C)   Ht 5' 5"  (1.651 m)   Wt 186 lb 4 oz (84.5 kg)   SpO2 100%   BMI 30.99 kg/m?  ?Wt Readings from Last 3 Encounters:  ?11/14/21 186 lb 4 oz (84.5 kg)  ?05/16/21 186 lb (84.4 kg)  ?04/04/21 189 lb (85.7 kg)  ? ? ?Immunization History  ?Administered Date(s) Administered  ? Influenza,inj,Quad PF,6+ Mos 05/16/2016, 04/16/2018, 04/08/2019  ? Td 01/27/2017  ? ? ?Diabetic Foot Exam - Simple   ?No data filed ?  ? ? ?Lab Results  ?Component Value Date  ? TSH 1.580 04/05/2018  ? ?Lab Results  ?Component Value Date  ? WBC 4.6 04/04/2021  ? HGB 11.0 (L) 04/04/2021  ? HCT 38.1 04/04/2021  ? MCV 75 (L) 04/04/2021  ? PLT 309 04/04/2021  ? ?Lab Results  ?Component Value Date  ? NA 139 04/04/2021  ? K 4.2 04/04/2021  ? CO2 23 04/05/2018  ? GLUCOSE 92 04/04/2021  ? BUN 10 04/04/2021  ? CREATININE 0.68 04/04/2021  ? BILITOT 0.3 04/04/2021  ? ALKPHOS 59 04/04/2021  ? AST 16 04/04/2021  ? ALT 15 04/05/2018  ? PROT 7.3 04/04/2021  ? ALBUMIN 4.3 04/04/2021  ? CALCIUM 9.5 04/04/2021  ? EGFR 104 04/04/2021  ? ?Lab Results  ?Component Value Date  ? CHOL 146 04/04/2021  ? CHOL 123 04/05/2018  ? ?Lab Results  ?Component Value Date  ? HDL 59 04/04/2021  ? HDL 50 04/05/2018  ? ?Lab Results  ?Component Value Date  ? [place] 78 04/04/2021  ? [place] 66 04/05/2018  ? ?Lab Results  ?Component Value Date  ? TRIG 40 04/04/2021  ? TRIG 35 04/05/2018  ? ?Lab Results  ?Component Value Date  ? CHOLHDL 2.5 04/04/2021  ? CHOLHDL 2.5 04/05/2018  ? ?Lab Results  ?Component Value Date  ? HGBA1C 5.2 11/22/2016  ? ? ?   ?Assessment & Plan:  ? ?Problem List Items Addressed This Visit   ? ?  ? Musculoskeletal and Integument  ? Rash and nonspecific skin eruption - Primary  ? Relevant Medications  ? triamcinolone  0.1%-Aquaphor equivlanet 1:1 ointment mixture, initiated during visit  ?Discussed non pharmacological methods for management of symptoms ?Informed to take OTC medications as needed ?  ? ?Other Visit Diagnoses   ? ? Other eczema      ? Relevant Medications  ?  triamcinolone 0.1%-Aquaphor equivlanet 1:1 ointment mixture  ? Other Relevant Orders  ? Ambulatory referral to Allergy  ? Elevated BP without diagnosis of hypertension     ?Informed to continue checking B/P at home intermittently   ? White coat syndrome without diagnosis of hypertension      ? ?Follow up in 6 mths for reevaluation of eczema, sooner as needed   ? ? ?I have discontinued Willa Rough. [place]'s triamcinolone cream. I am also having her start on triamcinolone 0.1%-Aquaphor equivlanet 1:1 ointment mixture. Additionally, I am having her maintain her hydrOXYzine. ? ?Meds ordered this encounter  ?Medications  ? triamcinolone 0.1%-Aquaphor equivlanet 1:1 ointment mixture  ?  Sig: Apply topically 2 (two) times daily.  ?  Dispense:  240 g  ?  Refill:  2  ? ? ? ?Teena Dunk, NP ?  ?

## 2021-11-14 NOTE — Patient Instructions (Signed)
You were seen today in the [facility] for reevaluation of eczema. You were prescribed medications, please take as directed. Please follow up in 6 mths for reevaluation eczema.  ?

## 2021-12-27 ENCOUNTER — Ambulatory Visit: Payer: Self-pay | Admitting: *Deleted

## 2021-12-27 VITALS — BP 123/86 | Ht 64.0 in | Wt 186.0 lb

## 2021-12-27 DIAGNOSIS — R718 Other abnormality of red blood cells: Secondary | ICD-10-CM

## 2021-12-27 DIAGNOSIS — Z0000 Encounter for general adult medical examination without abnormal findings: Secondary | ICD-10-CM

## 2021-12-27 NOTE — Progress Notes (Signed)
[employer] insurance premium discount evaluation: Labs Drawn. Replacements ROI form signed. Tobacco Free Attestation form signed.  Forms placed in paper chart.  

## 2021-12-28 ENCOUNTER — Encounter: Payer: Self-pay | Admitting: Registered Nurse

## 2021-12-28 LAB — CMP12+LP+TP+TSH+6AC+CBC/D/PLT
ALT: 22 IU/L (ref 0–32)
AST: 23 IU/L (ref 0–40)
Albumin/Globulin Ratio: 1.7 (ref 1.2–2.2)
Albumin: 4.3 g/dL (ref 3.8–4.9)
Alkaline Phosphatase: 65 IU/L (ref 44–121)
BUN/Creatinine Ratio: 13 (ref 9–23)
BUN: 8 mg/dL (ref 6–24)
Basophils Absolute: 0 10*3/uL (ref 0.0–0.2)
Basos: 1 %
Bilirubin Total: 0.4 mg/dL (ref 0.0–1.2)
Calcium: 9.6 mg/dL (ref 8.7–10.2)
Chloride: 104 mmol/L (ref 96–106)
Chol/HDL Ratio: 2.3 ratio (ref 0.0–4.4)
Cholesterol, Total: 139 mg/dL (ref 100–199)
Creatinine, Ser: 0.64 mg/dL (ref 0.57–1.00)
EOS (ABSOLUTE): 0.1 10*3/uL (ref 0.0–0.4)
Eos: 2 %
Estimated CHD Risk: <0.5 times avg. (ref 0.0–1.0)
Free Thyroxine Index: 2 (ref 1.2–4.9)
GGT: 14 IU/L (ref 0–60)
Globulin, Total: 2.6 g/dL (ref 1.5–4.5)
Glucose: 81 mg/dL (ref 70–99)
HDL: 61 mg/dL (ref >39)
Hematocrit: 39.3 % (ref 34.0–46.6)
Hemoglobin: 11.8 g/dL (ref 11.1–15.9)
Immature Grans (Abs): 0 10*3/uL (ref 0.0–0.1)
Immature Granulocytes: 0 %
Iron: 89 ug/dL (ref 27–159)
LDH: 185 IU/L (ref 119–226)
LDL Chol Calc (NIH): 68 mg/dL (ref 0–99)
Lymphocytes Absolute: 1.4 10*3/uL (ref 0.7–3.1)
Lymphs: 32 %
MCH: 22.2 pg — ABNORMAL LOW (ref 26.6–33.0)
MCHC: 30 g/dL — ABNORMAL LOW (ref 31.5–35.7)
MCV: 74 fL — ABNORMAL LOW (ref 79–97)
Monocytes Absolute: 0.3 10*3/uL (ref 0.1–0.9)
Monocytes: 7 %
Neutrophils Absolute: 2.5 10*3/uL (ref 1.4–7.0)
Neutrophils: 58 %
Phosphorus: 3.8 mg/dL (ref 3.0–4.3)
Platelets: 335 10*3/uL (ref 150–450)
Potassium: 4.3 mmol/L (ref 3.5–5.2)
RBC: 5.31 x10E6/uL — ABNORMAL HIGH (ref 3.77–5.28)
RDW: 14.4 % (ref 11.7–15.4)
Sodium: 141 mmol/L (ref 134–144)
T3 Uptake Ratio: 26 % (ref 24–39)
T4, Total: 7.8 ug/dL (ref 4.5–12.0)
TSH: 1.42 u[IU]/mL (ref 0.450–4.500)
Total Protein: 6.9 g/dL (ref 6.0–8.5)
Triglycerides: 40 mg/dL (ref 0–149)
Uric Acid: 5.5 mg/dL (ref 3.0–7.2)
VLDL Cholesterol Cal: 10 mg/dL (ref 5–40)
WBC: 4.3 10*3/uL (ref 3.4–10.8)
eGFR: 105 mL/min/{1.73_m2} (ref >59)

## 2021-12-28 LAB — HGB A1C W/O EAG: Hgb A1c MFr Bld: 5.4 % (ref 4.8–5.6)

## 2021-12-28 NOTE — Addendum Note (Signed)
Addended by: Albina Billet A on: 12/28/2021 03:01 PM   Modules accepted: Orders

## 2022-01-20 ENCOUNTER — Encounter: Payer: Self-pay | Admitting: Internal Medicine

## 2022-01-20 ENCOUNTER — Ambulatory Visit (INDEPENDENT_AMBULATORY_CARE_PROVIDER_SITE_OTHER): Payer: No Typology Code available for payment source | Admitting: Internal Medicine

## 2022-01-20 VITALS — BP 130/82 | HR 88 | Temp 97.6°F | Resp 16 | Ht 65.0 in | Wt 181.4 lb

## 2022-01-20 DIAGNOSIS — L308 Other specified dermatitis: Secondary | ICD-10-CM | POA: Diagnosis not present

## 2022-01-20 NOTE — Progress Notes (Signed)
New Patient Note  RE: Cheyenne Hubbard MRN: [id number] DOB: 1966/01/14 Date of Office Visit: 01/20/2022  Consult requested by: Kathrynn Speed, NP Primary care provider: Barbette Merino, NP  Chief Complaint: Establish Care and Eczema  History of Present Illness: I had the pleasure of seeing Cheyenne Hubbard for initial evaluation at the [facility] on 01/20/2022. She is a 56 y.o. female, who is referred here by Barbette Merino, NP for the evaluation of chronic  dermatitis .  History obtained from patient  and  husband .  She reports a greater than 10-year history of longstanding dermatitis which flares on bilateral shins and back.  She has been treated by dermatology with topical steroids and reports dermatitis will respond to topical steroids but will occur when she stops within 3 days.  She had a previous skin biopsy with results as below.  She denies any atopic history to include allergic rhinitis, asthma, eczema.  She is confused why dermatitis continues to recur and is open to more systemic therapy.  She stopped following dermatology due to dermatologist moving locations and no longer being available.  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 No spongiform pustules are seen. A PAS stain was performed after H and E review as a dermatophyte infection was in the differential diagnosis for this inflammatory process. It is negative. Controls stained appropriately. The entire specimen has been submitted with multiple sections examined.  COMMENT: The differential diagnosis includes chronic contact or nummular dermatitis, partially treated psoriasis and psoriasiform drug eruption. There is no evidence of malignancy in the sections  examined microscopically. Correlation with the clinical findings is suggested. Assessment and Plan: Cheyenne Hubbard is a 56 y.o. female with: Other specified dermatitis Plan: Patient Instructions   Dermatitis  -Previous biopsy showed nummular dermatitis versus chronic contact dermatitis versus psoriasis -All of these can be waxing and waning rashes which explains the response to topical steroid -Continue topical steroid on all areas were skin texture is not normal -Return to clinic for patch testing -We will refer you to dermatology after patch testing for further evaluation and treatment  Follow up: patch testing   Thank you so much for letting me partake in your care today.  Don't hesitate to reach out if you have any additional concerns!  Ferol Luz, MD  [facility] [place] [place]  No follow-ups on file.  No orders of the defined types were placed in this encounter.  Lab Orders  No laboratory test(s) ordered today    Other allergy screening: Asthma: no Rhino conjunctivitis: no Food allergy: no Medication allergy: no Hymenoptera allergy: no Urticaria: no Eczema:no History of recurrent infections suggestive of immunodeficency: no  Diagnostics: None done   Past Medical History: Patient Active Problem List   Diagnosis Date Noted   Fungal infection of skin 12/06/2016   Rash and nonspecific skin eruption 12/06/2016   Anemia 12/06/2016   History reviewed. No pertinent past medical history. Past Surgical History: History reviewed. No pertinent surgical history. Medication List:  Current Outpatient Medications  Medication Sig Dispense Refill   hydrOXYzine (ATARAX/VISTARIL) 25 MG tablet Take 0.5-1 tablets (12.5-25 mg total) by mouth every 8 (eight) hours as needed for itching. (Patient not taking: Reported on 12/27/2021) 30 tablet 5   triamcinolone 0.1%-Aquaphor equivlanet 1:1  ointment mixture Apply topically 2 (two) times daily. (Patient not taking:  Reported on 12/27/2021) 240 g 2   No current facility-administered medications for this visit.   Allergies: Allergies  Allergen Reactions   Quinolones Itching   Social History: Social History   Socioeconomic History   Marital status: Married    Spouse name: Not on file   Number of children: Not on file   Years of education: Not on file   Highest education level: Not on file  Occupational History   Not on file  Tobacco Use   Smoking status: Never   Smokeless tobacco: Never  Vaping Use   Vaping Use: Never used  Substance and Sexual Activity   Alcohol use: Not Currently    Comment: occ   Drug use: No   Sexual activity: Yes    Birth control/protection: None  Other Topics Concern   Not on file  Social History Narrative   Not on file   Social Determinants of Health   Financial Resource Strain: Not on file  Food Insecurity: Not on file  Transportation Needs: Not on file  Physical Activity: Not on file  Stress: Not on file  Social Connections: Not on file   Lives in a in apartment, there are no roaches in the house and bed is 2 feet off the floor.  They do not use dust mite precautions on better pillows.  She is not exposed to fumes, chemicals or dust in jobs or hobbies.  There is no HEPA filter in the home and [place].. 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   Objective: BP 130/82   Pulse 88   Temp 97.6 F (36.4 C)   Resp 16   Ht 5\' 5"  (1.651 m)   Wt 181 lb 6 oz (82.3 kg)   SpO2 98%   BMI 30.18 kg/m  Body mass index is 30.18  kg/m.  General Appearance:  Alert, cooperative, no distress, appears stated age  Head:  Normocephalic, without obvious abnormality, atraumatic  Eyes:  Conjunctiva clear, EOM's intact  Nose: Nares normal, normal mucosa, no visible anterior polyps, and septum midline  Throat: Lips, tongue normal; teeth and gums normal, normal posterior oropharynx  Neck: Supple, symmetrical  Lungs:   clear to auscultation bilaterally, Respirations unlabored, no coughing  Heart:  regular rate and rhythm and no murmur, Appears well perfused  Extremities: No edema  Skin: Skin color, texture, turgor normal, hyperpigmented papules to erythematous base on bilateral shins and back in various stages of healing  Neurologic: No gross deficits   The plan was reviewed with the patient/family, and all questions/concerned were addressed.  It was my pleasure to see Amanpreet today and participate in her care. Please feel free to contact me with any questions or concerns.  Sincerely,  [name], MD Allergy & Immunology  [facility] office: [phone] [facility] office: [phone]

## 2022-01-20 NOTE — Patient Instructions (Signed)
Dermatitis  -Previous biopsy showed nummular dermatitis versus chronic contact dermatitis versus psoriasis -All of these can be waxing and waning rashes which explains the response to topical steroid -Continue topical steroid on all areas were skin texture is not normal -Return to clinic for patch testing -We will refer you to dermatology after patch testing for further evaluation and treatment  Follow up: patch testing   Thank you so much for letting me partake in your care today.  Don't hesitate to reach out if you have any additional concerns!  Ferol Luz, MD  [facility] [place] [place]

## 2022-02-03 DIAGNOSIS — L309 Dermatitis, unspecified: Secondary | ICD-10-CM | POA: Insufficient documentation

## 2022-02-03 DIAGNOSIS — L409 Psoriasis, unspecified: Secondary | ICD-10-CM | POA: Insufficient documentation

## 2022-02-03 DIAGNOSIS — L810 Postinflammatory hyperpigmentation: Secondary | ICD-10-CM | POA: Insufficient documentation

## 2022-02-03 DIAGNOSIS — L400 Psoriasis vulgaris: Secondary | ICD-10-CM | POA: Insufficient documentation

## 2022-02-03 NOTE — Progress Notes (Signed)
    Follow-up Note  RE: Cheyenne Hubbard MRN: [id number] DOB: Apr 08, 1966 Date of Office Visit: 02/06/2022  Primary care provider: Barbette Merino, NP Referring provider: Barbette Merino, NP   [name] returns to the office today for the patch test placement, given suspected history of contact dermatitis.    Diagnostics: True Test patches placed.    Plan:   Allergic contact dermatitis - Instructions provided on care of the patches for the next 48 hours. [name] was instructed to avoid showering for the next 48 hours. [name] will follow up in 48 hours and 96 hours for patch readings.    Tonny Bollman, MD [facility] [place]

## 2022-02-06 ENCOUNTER — Ambulatory Visit: Payer: No Typology Code available for payment source | Admitting: Internal Medicine

## 2022-02-26 NOTE — Progress Notes (Signed)
   Follow Up Note  RE: Jerzey Komperda Callow MRN: [id number] DOB: October 29, 1965 Date of Office Visit: 02/27/2022  Referring provider: Barbette Merino, NP Primary care provider: Barbette Merino, NP  History of Present Illness: I had the pleasure of seeing Tahtiana Rozier for a follow up visit at the [facility] on 02/27/2022. She is a 56 y.o. female, who is being followed for dermatitis. Today she is here for patch test placement, given suspected history of contact dermatitis.   Patient has not been using any topical steroid creams on her back. I do smell perfume on the patient today in the office.   Diagnostics: TRUE Test patches placed.   Assessment and Plan: Norena is a 56 y.o. female with: Allergic contact dermatitis due to other agents Patches placed today. Please avoid strenuous physical activities and do not get the patches on the back wet. No showering until final patch reading done. Okay to take antihistamines for itching but avoid placing any creams on the back where the patches are. We will remove the patches on Wednesday and will do our initial read. Then you will come back on Friday for a final read.   It was my pleasure to see Maddy today and participate in her care. Please feel free to contact me with any questions or concerns.  Sincerely,  Wyline Mood, DO Allergy & Immunology  [facility] office: [phone] [facility] office: [phone] [place] office: [phone]

## 2022-02-27 ENCOUNTER — Ambulatory Visit (INDEPENDENT_AMBULATORY_CARE_PROVIDER_SITE_OTHER): Payer: No Typology Code available for payment source | Admitting: Allergy

## 2022-02-27 ENCOUNTER — Encounter: Payer: Self-pay | Admitting: Allergy

## 2022-02-27 VITALS — BP 128/80 | HR 80 | Temp 97.8°F | Resp 16

## 2022-02-27 DIAGNOSIS — L2389 Allergic contact dermatitis due to other agents: Secondary | ICD-10-CM | POA: Diagnosis not present

## 2022-02-27 NOTE — Patient Instructions (Signed)
Patches placed today. Please avoid strenuous physical activities and do not get the patches on the back wet. No showering until final patch reading done. Okay to take antihistamines for itching but avoid placing any creams on the back where the patches are. We will remove the patches on Wednesday and will do our initial read. Then you will come back on Friday for a final read. 

## 2022-02-27 NOTE — Assessment & Plan Note (Signed)
Patches placed today. Please avoid strenuous physical activities and do not get the patches on the back wet. No showering until final patch reading done. Okay to take antihistamines for itching but avoid placing any creams on the back where the patches are. We will remove the patches on Wednesday and will do our initial read. Then you will come back on Friday for a final read. 

## 2022-03-01 ENCOUNTER — Ambulatory Visit (INDEPENDENT_AMBULATORY_CARE_PROVIDER_SITE_OTHER): Payer: No Typology Code available for payment source | Admitting: Allergy

## 2022-03-01 DIAGNOSIS — L2389 Allergic contact dermatitis due to other agents: Secondary | ICD-10-CM | POA: Diagnosis not present

## 2022-03-01 NOTE — Progress Notes (Unsigned)
    Follow-up Note  RE: Cheyenne Hubbard MRN: [id number] DOB: [birth year] Date of Office Visit: 03/01/2022  Primary care provider: Barbette Merino, NP Referring provider: Barbette Merino, NP   [name] returns to the office today for the initial patch test interpretation, given suspected history of contact dermatitis.    Diagnostics:  TRUE TEST 48 hour reading:  Patch 1 appears to have shifted between placement and reading today.  There does appear to be a positive allergen with erythema and possible papules however due to the shift I am not sure which allergen is response to.  We have placed a new patch panel 1 on the right side of her back for reading on Friday.  Patch panels 2 and 3 appear to have remained in place and are negative   She does have erythema and papules in the areas of the adhesive tape.  Plan:  Allergic contact dermatitis It does appear that she may have a positive allergy however due to shift of the patch I am unable to determine which allergen this is Does appear that she has a contact allergy to adhesives Return in 2 days for final reading  Margo Aye, MD [facility]

## 2022-03-02 ENCOUNTER — Encounter: Payer: Self-pay | Admitting: Allergy

## 2022-03-03 ENCOUNTER — Ambulatory Visit (INDEPENDENT_AMBULATORY_CARE_PROVIDER_SITE_OTHER): Payer: No Typology Code available for payment source | Admitting: Internal Medicine

## 2022-03-03 DIAGNOSIS — L2389 Allergic contact dermatitis due to other agents: Secondary | ICD-10-CM

## 2022-03-03 NOTE — Progress Notes (Signed)
   Follow Up Note  RE: Cheyenne Hubbard MRN: [id number] DOB: 07-09-1966 Date of Office Visit: 03/03/2022  Referring provider: Barbette Merino, NP Primary care provider: Barbette Merino, NP  History of Present Illness: I had the pleasure of seeing Cheyenne Hubbard for a follow up visit at the [facility] on 03/03/2022. She is a 56 y.o. female, who is being followed for dermatitis . Today she is here for final patch test interpretation, given suspected history of contact dermatitis.   Diagnostics:  TRUE TEST 96 hour reading:   Mildly positive to potassium dichromate and thiuram mix    Assessment and Plan: Cheyenne Hubbard is a 56 y.o. female with: Concern for Contact Dermatitis:  The patient has been provided detailed information regarding the substances she is sensitive to, as well as products containing the substances.  Meticulous avoidance of these substances is recommended. If avoidance is not possible, the use of barrier creams or lotions is recommended. If symptoms persist or progress despite meticulous avoidance of chemicals/substances above, dermatology evaluation may be warranted. No follow-ups on file.  It was my pleasure to see Cheyenne Hubbard today and participate in her care. Please feel free to contact me with any questions or concerns.  Sincerely,   Ferol Luz, MD [facility] [place]

## 2022-04-24 ENCOUNTER — Other Ambulatory Visit: Payer: Self-pay

## 2022-04-24 DIAGNOSIS — E559 Vitamin D deficiency, unspecified: Secondary | ICD-10-CM

## 2022-04-24 DIAGNOSIS — R718 Other abnormality of red blood cells: Secondary | ICD-10-CM

## 2022-04-24 NOTE — Progress Notes (Signed)
Labs drawn from L antecubital with 23 g butterfly without difficulty.  2x2s and coflex applied.  Pt instructed to leave in place for 15-20 minutes then remove and replace with bandaid given to patient.  Patient verbalized understanding.

## 2022-04-25 ENCOUNTER — Other Ambulatory Visit: Payer: Self-pay | Admitting: Registered Nurse

## 2022-04-25 ENCOUNTER — Encounter: Payer: Self-pay | Admitting: Registered Nurse

## 2022-04-25 DIAGNOSIS — E559 Vitamin D deficiency, unspecified: Secondary | ICD-10-CM

## 2022-04-25 LAB — CBC WITH DIFFERENTIAL/PLATELET
Basophils Absolute: 0 10*3/uL (ref 0.0–0.2)
Basos: 1 %
EOS (ABSOLUTE): 0.1 10*3/uL (ref 0.0–0.4)
Eos: 1 %
Hematocrit: 43.1 % (ref 34.0–46.6)
Hemoglobin: 12.1 g/dL (ref 11.1–15.9)
Immature Grans (Abs): 0 10*3/uL (ref 0.0–0.1)
Immature Granulocytes: 0 %
Lymphocytes Absolute: 1.6 10*3/uL (ref 0.7–3.1)
Lymphs: 31 %
MCH: 21.4 pg — ABNORMAL LOW (ref 26.6–33.0)
MCHC: 28.1 g/dL — ABNORMAL LOW (ref 31.5–35.7)
MCV: 76 fL — ABNORMAL LOW (ref 79–97)
Monocytes Absolute: 0.3 10*3/uL (ref 0.1–0.9)
Monocytes: 6 %
Neutrophils Absolute: 3.3 10*3/uL (ref 1.4–7.0)
Neutrophils: 61 %
Platelets: 338 10*3/uL (ref 150–450)
RBC: 5.66 x10E6/uL — ABNORMAL HIGH (ref 3.77–5.28)
RDW: 14.3 % (ref 11.7–15.4)
WBC: 5.3 10*3/uL (ref 3.4–10.8)

## 2022-04-25 LAB — VITAMIN D 25 HYDROXY (VIT D DEFICIENCY, FRACTURES): Vit D, 25-Hydroxy: 16.2 ng/mL — ABNORMAL LOW (ref 30.0–100.0)

## 2022-04-25 MED ORDER — CHOLECALCIFEROL 1.25 MG (50000 UT) PO TABS: 1.0000 | ORAL_TABLET | ORAL | 0 refills | Status: AC

## 2022-05-16 ENCOUNTER — Other Ambulatory Visit: Payer: Self-pay | Admitting: Nurse Practitioner

## 2022-05-16 DIAGNOSIS — Z1231 Encounter for screening mammogram for malignant neoplasm of breast: Secondary | ICD-10-CM

## 2022-05-17 ENCOUNTER — Ambulatory Visit: Payer: No Typology Code available for payment source | Admitting: Nurse Practitioner

## 2022-05-20 ENCOUNTER — Ambulatory Visit
Admission: RE | Admit: 2022-05-20 | Discharge: 2022-05-20 | Disposition: A | Discharge status: Home / Self Care | Payer: No Typology Code available for payment source | Source: Ambulatory Visit | Attending: Nurse Practitioner | Admitting: Nurse Practitioner

## 2022-05-20 DIAGNOSIS — Z1231 Encounter for screening mammogram for malignant neoplasm of breast: Secondary | ICD-10-CM

## 2022-06-07 ENCOUNTER — Ambulatory Visit (INDEPENDENT_AMBULATORY_CARE_PROVIDER_SITE_OTHER): Payer: No Typology Code available for payment source | Admitting: Nurse Practitioner

## 2022-06-07 ENCOUNTER — Encounter: Payer: Self-pay | Admitting: Nurse Practitioner

## 2022-06-07 VITALS — BP 135/82 | HR 86 | Ht 65.0 in | Wt 196.0 lb

## 2022-06-07 DIAGNOSIS — L409 Psoriasis, unspecified: Secondary | ICD-10-CM

## 2022-06-07 DIAGNOSIS — E559 Vitamin D deficiency, unspecified: Secondary | ICD-10-CM

## 2022-06-07 NOTE — Progress Notes (Signed)
@Patient  ID: , female    DOB: Apr 03, 1966, 56 y.o.   MRN: [id number]  Chief Complaint  Patient presents with   Follow-up    Follow-up--from asthma/allergy-rash.  Vitamin D recheck    Referring provider: [id number], NP   HPI  56 year old female with history of plaque psoriasis, rash, allergic dermatitis, anemia  Since patient's last visit here with 57 she has followed with asthma and allergy for rash. She was diagnosed by dermatology with Psoriasis, but was not given any treatment. She still has rash - will refer to dermatology.   Patient was also diagnosed with low vitamin D and is on treatment. Will need recheck in 2 months.  Denies f/c/s, n/v/d, hemoptysis, PND, leg swelling Denies chest pain or edema     Allergies  Allergen Reactions   Quinolones Itching    Immunization History  Administered Date(s) Administered   Influenza,inj,Quad PF,6+ Mos 05/16/2016, 04/16/2018, 04/08/2019   Td 01/27/2017    History reviewed. No pertinent past medical history.  Tobacco History: Social History   Tobacco Use  Smoking Status Never  Smokeless Tobacco Never   Counseling given: Not Answered   Outpatient Encounter Medications as of 06/07/2022  Medication Sig   Cholecalciferol 1.25 MG (50000 UT) TABS Take 1 tablet by mouth once a week for 12 doses.   hydrOXYzine (ATARAX/VISTARIL) 25 MG tablet Take 0.5-1 tablets (12.5-25 mg total) by mouth every 8 (eight) hours as needed for itching.   No facility-administered encounter medications on file as of 06/07/2022.     Review of Systems  Review of Systems  Constitutional: Negative.   HENT: Negative.    Cardiovascular: Negative.   Gastrointestinal: Negative.   Allergic/Immunologic: Negative.   Neurological: Negative.   Psychiatric/Behavioral: Negative.         Physical Exam  BP 135/82   Pulse 86   Ht 5\' 5"  (1.651 m)   Wt 196 lb (88.9 kg)   SpO2 95%   BMI 32.62 kg/m   Wt Readings  from Last 5 Encounters:  06/07/22 196 lb (88.9 kg)  01/20/22 181 lb 6 oz (82.3 kg)  12/27/21 186 lb (84.4 kg)  11/14/21 186 lb 4 oz (84.5 kg)  05/16/21 186 lb (84.4 kg)     Physical Exam Vitals and nursing note reviewed.  Constitutional:      General: She is not in acute distress.    Appearance: She is well-developed.  Cardiovascular:     Rate and Rhythm: Normal rate and regular rhythm.  Pulmonary:     Effort: Pulmonary effort is normal.     Breath sounds: Normal breath sounds.  Neurological:     Mental Status: She is alert and oriented to person, place, and time.      Lab Results:  CBC    Component Value Date/Time   WBC 5.3 04/24/2022 1046   RBC 5.66 (H) 04/24/2022 1046   HGB 12.1 04/24/2022 1046   HCT 43.1 04/24/2022 1046   PLT 338 04/24/2022 1046   MCV 76 (L) 04/24/2022 1046   MCH 21.4 (L) 04/24/2022 1046   MCHC 28.1 (L) 04/24/2022 1046   RDW 14.3 04/24/2022 1046   LYMPHSABS 1.6 04/24/2022 1046   EOSABS 0.1 04/24/2022 1046   BASOSABS 0.0 04/24/2022 1046    BMET    Component Value Date/Time   NA 141 12/27/2021 0900   K 4.3 12/27/2021 0900   CL 104 12/27/2021 0900   CO2 23 04/05/2018 1634   GLUCOSE 81 12/27/2021  0900   BUN 8 12/27/2021 0900   CREATININE 0.64 12/27/2021 0900   CALCIUM 9.6 12/27/2021 0900   GFRNONAA 102 04/05/2018 1634   GFRAA 118 04/05/2018 1634    BNP No results found for: "BNP"  ProBNP No results found for: "PROBNP"  Imaging: MM 3D SCREEN BREAST BILATERAL  Result Date: 05/23/2022 CLINICAL DATA:  Screening. EXAM: DIGITAL SCREENING BILATERAL MAMMOGRAM WITH TOMOSYNTHESIS AND CAD TECHNIQUE: Bilateral screening digital craniocaudal and mediolateral oblique mammograms were obtained. Bilateral screening digital breast tomosynthesis was performed. The images were evaluated with computer-aided detection. COMPARISON:  None available. ACR Breast Density Category c: The breast tissue is heterogeneously dense, which may obscure small masses  FINDINGS: There are no findings suspicious for malignancy. IMPRESSION: No mammographic evidence of malignancy. A result letter of this screening mammogram will be mailed directly to the patient. RECOMMENDATION: Screening mammogram in one year. (Code:SM-B-01Y) BI-RADS CATEGORY  1: Negative. Electronically Signed   By: Annia Belt M.D.   On: 05/23/2022 12:52     Assessment & Plan:   Psoriasis - Ambulatory referral to Dermatology  2. Vitamin D deficiency   Follow up:  Follow up in 2 months -recheck vitamin d      Ivonne Andrew, NP 06/07/2022

## 2022-06-07 NOTE — Assessment & Plan Note (Signed)
-   Ambulatory referral to Dermatology  2. Vitamin D deficiency   Follow up:  Follow up in 2 months -recheck vitamin d

## 2022-06-07 NOTE — Patient Instructions (Signed)
1. Psoriasis  - Ambulatory referral to Dermatology  2. Vitamin D deficiency   Follow up:  Follow up in 2 months -recheck vitamin d

## 2022-08-02 ENCOUNTER — Ambulatory Visit: Payer: Self-pay | Admitting: Nurse Practitioner

## 2022-09-15 ENCOUNTER — Encounter: Payer: Self-pay | Admitting: Registered Nurse

## 2022-09-15 ENCOUNTER — Telehealth: Payer: Self-pay | Admitting: Registered Nurse

## 2022-09-15 DIAGNOSIS — Z0000 Encounter for general adult medical examination without abnormal findings: Secondary | ICD-10-CM

## 2022-09-15 DIAGNOSIS — Z862 Personal history of diseases of the blood and blood-forming organs and certain disorders involving the immune mechanism: Secondary | ICD-10-CM

## 2022-09-15 DIAGNOSIS — Z8639 Personal history of other endocrine, nutritional and metabolic disease: Secondary | ICD-10-CM

## 2022-09-15 DIAGNOSIS — E559 Vitamin D deficiency, unspecified: Secondary | ICD-10-CM

## 2022-09-15 NOTE — Telephone Encounter (Signed)
History vitamin D deficiency Executive panel, A1c, Vitamin D ordered for appt 2/29 fasting

## 2022-09-21 ENCOUNTER — Other Ambulatory Visit: Payer: Self-pay | Admitting: Occupational Medicine

## 2022-09-21 DIAGNOSIS — Z0000 Encounter for general adult medical examination without abnormal findings: Secondary | ICD-10-CM

## 2022-09-21 DIAGNOSIS — E559 Vitamin D deficiency, unspecified: Secondary | ICD-10-CM

## 2022-09-21 NOTE — Progress Notes (Signed)
Lab drawn tolerated well no issues noted.

## 2022-09-22 LAB — CMP12+LP+TP+TSH+6AC+CBC/D/PLT
ALT: 12 IU/L (ref 0–32)
AST: 16 IU/L (ref 0–40)
Albumin/Globulin Ratio: 1.5 (ref 1.2–2.2)
Albumin: 4.1 g/dL (ref 3.8–4.9)
Alkaline Phosphatase: 74 IU/L (ref 44–121)
BUN/Creatinine Ratio: 10 (ref 9–23)
BUN: 7 mg/dL (ref 6–24)
Basophils Absolute: 0 10*3/uL (ref 0.0–0.2)
Basos: 1 %
Bilirubin Total: 0.3 mg/dL (ref 0.0–1.2)
Calcium: 9.4 mg/dL (ref 8.7–10.2)
Chloride: 104 mmol/L (ref 96–106)
Chol/HDL Ratio: 2.1 ratio (ref 0.0–4.4)
Cholesterol, Total: 127 mg/dL (ref 100–199)
Creatinine, Ser: 0.72 mg/dL (ref 0.57–1.00)
EOS (ABSOLUTE): 0.1 10*3/uL (ref 0.0–0.4)
Eos: 2 %
Estimated CHD Risk: <0.5 times avg. (ref 0.0–1.0)
Free Thyroxine Index: 2 (ref 1.2–4.9)
GGT: 11 IU/L (ref 0–60)
Globulin, Total: 2.8 g/dL (ref 1.5–4.5)
Glucose: 93 mg/dL (ref 70–99)
HDL: 61 mg/dL (ref >39)
Hematocrit: 39.8 % (ref 34.0–46.6)
Hemoglobin: 11.5 g/dL (ref 11.1–15.9)
Immature Grans (Abs): 0 10*3/uL (ref 0.0–0.1)
Immature Granulocytes: 0 %
Iron: 69 ug/dL (ref 27–159)
LDH: 170 IU/L (ref 119–226)
LDL Chol Calc (NIH): 56 mg/dL (ref 0–99)
Lymphocytes Absolute: 1.6 10*3/uL (ref 0.7–3.1)
Lymphs: 38 %
MCH: 21.9 pg — ABNORMAL LOW (ref 26.6–33.0)
MCHC: 28.9 g/dL — ABNORMAL LOW (ref 31.5–35.7)
MCV: 76 fL — ABNORMAL LOW (ref 79–97)
Monocytes Absolute: 0.3 10*3/uL (ref 0.1–0.9)
Monocytes: 8 %
Neutrophils Absolute: 2.1 10*3/uL (ref 1.4–7.0)
Neutrophils: 51 %
Phosphorus: 3.3 mg/dL (ref 3.0–4.3)
Platelets: 342 10*3/uL (ref 150–450)
Potassium: 4.1 mmol/L (ref 3.5–5.2)
RBC: 5.25 x10E6/uL (ref 3.77–5.28)
RDW: 14.7 % (ref 11.7–15.4)
Sodium: 141 mmol/L (ref 134–144)
T3 Uptake Ratio: 26 % (ref 24–39)
T4, Total: 7.8 ug/dL (ref 4.5–12.0)
TSH: 2.59 u[IU]/mL (ref 0.450–4.500)
Total Protein: 6.9 g/dL (ref 6.0–8.5)
Triglycerides: 41 mg/dL (ref 0–149)
Uric Acid: 5.2 mg/dL (ref 3.0–7.2)
VLDL Cholesterol Cal: 10 mg/dL (ref 5–40)
WBC: 4.1 10*3/uL (ref 3.4–10.8)
eGFR: 99 mL/min/{1.73_m2} (ref >59)

## 2022-09-22 LAB — VITAMIN D 25 HYDROXY (VIT D DEFICIENCY, FRACTURES): Vit D, 25-Hydroxy: 39.6 ng/mL (ref 30.0–100.0)

## 2022-09-22 LAB — HEMOGLOBIN A1C
Est. average glucose Bld gHb Est-mCnc: 114 mg/dL
Hgb A1c MFr Bld: 5.6 % (ref 4.8–5.6)

## 2022-09-23 ENCOUNTER — Encounter: Payer: Self-pay | Admitting: Registered Nurse

## 2022-09-23 MED ORDER — CHOLECALCIFEROL 1.25 MG (50000 UT) PO TABS: 1.0000 | ORAL_TABLET | ORAL | 2 refills | Status: AC

## 2022-09-23 MED ORDER — VITAMIN D3 25 MCG (1000 UT) PO CAPS: 1000.0000 [IU] | ORAL_CAPSULE | Freq: Every day | ORAL | 2 refills | Status: AC

## 2022-09-23 NOTE — Progress Notes (Signed)
My chart message sent to patient Cheyenne Hubbard, Your complete blood count improving but iron decreasing.  Are you taking a women's multivitamin daily?  I recommend USP or ISP tested product with iron.  I recommend repeat CBC and iron in 6 months.  If you want printed copy of results or results sent to another provider please see RN Kimrey.  [facility]care handouts on completed blood count, prediabetes and prediabetes diet sent to your my chart.   Your vitamin D normal after taking vitamin D start 1000 units daily with meal for the summer.  Restart the 50,000 units weekly in the fall again.  I recommend repeat level in 6-12 months.  Fasting labs again in 1 year.  Have you had any menses/period in since October? 3 month blood sugar average increasing and if elevates further will be prediabetes level.  Follow up with PCM low MCV/MCH/MCHC (size and volume of red blood cells) stable but still low.  I recommend 15 minutes sunlight on skin daily and consider 1000 international units D3 po daily take with fattiest meal for best absorption; BP 135/82 and BMI 32.6 (PCM visit 06/07/22) elevated above recommended levels blood pressure greater than 110s/60s and height weight ratio of 25 I recommend weight loss, exercise 150 minutes per week; dietary fiber 20 grams women per up to date; eat whole grains/fruits/vegetables; keep added sugars to less than 100 calories/ 5 teaspoons for women per American Heart Association; blood sugar, cholesterol, vitamin D, electrolytes, iron, kidney/liver function, thyroid function normal.  No anemia or infection on complete blood count.  Consider appt with [facility] dietitian (free x 6) to help with weight loss/blood pressure link to schedule [facility] ([email or web address])  You met requirements for Be Well 2025 insurance discount see RN Evlyn Kanner to sign paperwork  Please let us know if you have further questions or concerns. Sincerely, Gerarda Fraction NP-C

## 2022-09-23 NOTE — Addendum Note (Signed)
Addended by: Gerarda Fraction A on: 09/23/2022 07:49 AM   Modules accepted: Orders

## 2022-10-03 ENCOUNTER — Ambulatory Visit: Payer: Self-pay | Admitting: Occupational Medicine

## 2022-10-03 DIAGNOSIS — Z0000 Encounter for general adult medical examination without abnormal findings: Secondary | ICD-10-CM

## 2022-10-03 NOTE — Progress Notes (Signed)
Be well insurance premium discount evaluation:    Patient completed PCM office visit epic reviewed by RN Evlyn Kanner and transcribed. Labs  Tobacco attestation signed. [facility] ROI formed signed. Forms placed in the chart.   Patient given handouts for [facility] pharmacies and discount drugs list,MyChart, Tele doc setup, Tele doc Behavioral, [place] counseling and [facility] counseling.  What to do for infectious illness protocol. Given handout for list of medications that can be filled at [facility]. Given Clinic hours and Clinic Email.  Lab follow ups scheduled

## 2022-10-04 NOTE — Progress Notes (Signed)
Noted patient not taking women's multivitamin.  I do recommend her to take one as iron decreasing.

## 2023-01-07 NOTE — Progress Notes (Signed)
noted 

## 2023-02-18 ENCOUNTER — Other Ambulatory Visit: Payer: Self-pay | Admitting: Registered Nurse

## 2023-02-18 NOTE — Progress Notes (Signed)
Noted not taking multivitamin with iron I recommend she starts or increase iron intake in food  [place],    Your iron level was normal but on lower end I recommend eating more iron rich foods or if this is not possible to start iron supplement 8mg  recommended per day  Amounts of iron in some foods: Canned clams: 3 ounces (oz) provides 26 milligrams (mg) of iron. Fortified, plain, dry cereal oats: 100 g provides 24.72 mg. White beans: One cup provides 21.09. Dark chocolate (45 to 69 percent cacao): One bar provides 12.99 mg. Cooked Pacific oysters: 3 oz provides 7.82 mg. Cooked spinach: One cup provides 6.43 mg. Beef liver: 3 oz provides 4.17 mg. Boiled and drained lentils: Half a cup provides 3.3 mg. Firm tofu: Half a cup provides 2.03 mg. Boiled and drained chickpeas: Half a cup provides 2.37 mg. Canned, stewed tomatoes: Half a cup provides 1.7 mg. Lean, ground beef: 3 oz provides 2.07 mg. Medium baked potato: This provides 1.87 mg. Roasted cashew nuts: 3 oz provides 2 mg.   Please let us know if you have further questions.   Sincerely,   Albina Billet NP-C

## 2023-02-18 NOTE — Progress Notes (Signed)
Patient met with RN Bess Kinds 10/03/22 discussed lab results and recommendations given printed copy results/results note; met Be Well 2025 discount requirements BP 133/82 LDL 56 Hgba1c 5.6 weight 196lbs 10 lb gain since last Be Well appt recommended weight loss.  NP signed provider paperwork 10/03/22  and RN Kimrey notified HR team met FY 2025 insurance discount requirements; my chart message sent to patient Cheyenne Hubbard,  Due to new national guidelines no further vitamin D testing will be completed at [facility] for Be Well labs.  Finish your vitamin D/cholecalciferol 50,000 units weekly with meal then start 1000 units by mouth daily with meal.  Upon review of CBC stable from last year next due 2025.  Lab appt for 03/28/23 cancelled.  Please let us know if you have further questions.  Albina Billet NP-C

## 2023-03-28 ENCOUNTER — Other Ambulatory Visit: Payer: Self-pay

## 2023-03-28 ENCOUNTER — Other Ambulatory Visit: Payer: Self-pay | Admitting: Registered Nurse

## 2023-03-29 LAB — CBC WITH DIFFERENTIAL/PLATELET
Basophils Absolute: 0 10*3/uL (ref 0.0–0.2)
Basos: 1 %
EOS (ABSOLUTE): 0.1 10*3/uL (ref 0.0–0.4)
Eos: 1 %
Hematocrit: 38.8 % (ref 34.0–46.6)
Hemoglobin: 11.3 g/dL (ref 11.1–15.9)
Immature Grans (Abs): 0 10*3/uL (ref 0.0–0.1)
Immature Granulocytes: 0 %
Lymphocytes Absolute: 1.7 10*3/uL (ref 0.7–3.1)
Lymphs: 39 %
MCH: 22.1 pg — ABNORMAL LOW (ref 26.6–33.0)
MCHC: 29.1 g/dL — ABNORMAL LOW (ref 31.5–35.7)
MCV: 76 fL — ABNORMAL LOW (ref 79–97)
Monocytes Absolute: 0.3 10*3/uL (ref 0.1–0.9)
Monocytes: 8 %
Neutrophils Absolute: 2.1 10*3/uL (ref 1.4–7.0)
Neutrophils: 51 %
Platelets: 362 10*3/uL (ref 150–450)
RBC: 5.11 x10E6/uL (ref 3.77–5.28)
RDW: 15.8 % — ABNORMAL HIGH (ref 11.7–15.4)
WBC: 4.2 10*3/uL (ref 3.4–10.8)

## 2023-04-05 ENCOUNTER — Ambulatory Visit (INDEPENDENT_AMBULATORY_CARE_PROVIDER_SITE_OTHER): Payer: No Typology Code available for payment source | Admitting: Dermatology

## 2023-04-05 ENCOUNTER — Ambulatory Visit: Payer: Self-pay | Admitting: Registered Nurse

## 2023-04-05 ENCOUNTER — Encounter: Payer: Self-pay | Admitting: Dermatology

## 2023-04-05 DIAGNOSIS — R5382 Chronic fatigue, unspecified: Secondary | ICD-10-CM

## 2023-04-05 DIAGNOSIS — L409 Psoriasis, unspecified: Secondary | ICD-10-CM | POA: Diagnosis not present

## 2023-04-05 DIAGNOSIS — L299 Pruritus, unspecified: Secondary | ICD-10-CM | POA: Diagnosis not present

## 2023-04-05 DIAGNOSIS — Z7189 Other specified counseling: Secondary | ICD-10-CM

## 2023-04-05 DIAGNOSIS — Z79899 Other long term (current) drug therapy: Secondary | ICD-10-CM

## 2023-04-05 NOTE — Progress Notes (Signed)
   New Patient Visit   Subjective  Cheyenne Hubbard is a 57 y.o. female who presents for the following: Rash. Lower legs, knees, thighs, buttocks, lower back. Dur: >10 years. Had this rash while living in [place] where she is from. Dx with psoriasis by a dermatologist in the past. Has been using OTC Hydrocortisone cream.  In the past she has used Betamethasone, Triamcinolone cream.  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   The patient has spots, moles and lesions to be evaluated, some may be new or changing and the patient may have concern these could be cancer.   The following portions of the chart were reviewed this encounter and updated as appropriate: medications, allergies, medical history  Review of Systems:  No other skin or systemic complaints except as noted in HPI or Assessment and Plan.  Objective  Well appearing patient in no apparent distress; mood and affect are within normal limits.  A focused examination was performed of the following areas: Face, back, legs, arms  Relevant exam findings are noted in the Assessment and Plan.                 Assessment & Plan   PSORIASIS With pruritus Exam: Well-demarcated erythematous papules/plaques with silvery scale, guttate pink scaly papules. 20% BSA.  Chronic and persistent condition with duration or expected duration over one year. Condition is bothersome/symptomatic for patient. Currently flared.   patient denies joint pain  Psoriasis is a chronic non-curable, but treatable genetic/hereditary disease that may have other systemic features affecting other organ systems such as joints (Psoriatic Arthritis). It is associated with an increased risk of inflammatory bowel disease, heart disease, non-alcoholic fatty liver disease, and depression.  Treatments include light and laser treatments; topical medications; and systemic medications including oral and injectables.  Treatment Plan: Start  Otezla samples as directed. Take 10 mg tablet once a day until gone, then take 20 mg tablet once a day until gone, then take 30 mg tablet once a day until you come back to our office.   Lot: [id number] Exp: 01/20/2025   Side effects of Otezla (apremilast) include diarrhea, nausea, headache, upper respiratory infection, depression, and weight decrease (5-10%). It should only be taken by pregnant women after a discussion regarding risks and benefits with their doctor. Goal is control of skin condition, not cure.  The use of [place] requires long term medication management, including periodic office visits.   If not improving with Henderson [place] will consider other biologic medication.     Return in about 6 weeks (around 05/16/2023).  I, Lawson Radar, CMA, am acting as scribe for Armida Sans, MD.   Documentation: I have reviewed the above documentation for accuracy and completeness, and I agree with the above.  Armida Sans, MD

## 2023-04-05 NOTE — Patient Instructions (Addendum)
Start Otezla samples as directed.  Take 10 mg tablet once a day until gone, then take 20 mg tablet once a day until gone, then take 30 mg tablet once a day until you come back to our office.   Side effects of Otezla (apremilast) include diarrhea, nausea, headache, upper respiratory infection, depression, and weight decrease (5-10%). It should only be taken by pregnant women after a discussion regarding risks and benefits with their doctor. Goal is control of skin condition, not cure.  The use of Henderson [place] requires long term medication management, including periodic office visits.   Gentle Skin Care Guide  1. Bathe no more than once a day.  2. Avoid bathing in hot water  3. Use a mild soap like Dove, Vanicream, Cetaphil, CeraVe. Can use Lever 2000 or Cetaphil antibacterial soap  4. Use soap only where you need it. On most days, use it under your arms, between your legs, and on your feet. Let the water rinse other areas unless visibly dirty.  5. When you get out of the bath/shower, use a towel to gently blot your skin dry, don't rub it.  6. While your skin is still a little damp, apply a moisturizing cream such as Vanicream, CeraVe, Cetaphil, Eucerin, Sarna lotion or plain Vaseline Jelly. For hands apply Neutrogena [place] Hand Cream or Excipial Hand Cream.  7. Reapply moisturizer any time you start to itch or feel dry.  8. Sometimes using free and clear laundry detergents can be helpful. Fabric softener sheets should be avoided. Downy Free & Gentle liquid, or any liquid fabric softener that is free of dyes and perfumes, it acceptable to use  9. If your doctor has given you prescription creams you may apply moisturizers over them    Due to recent changes in healthcare laws, you may see results of your pathology and/or laboratory studies on MyChart before the doctors have had a chance to review them. We understand that in some cases there may be results that are confusing or concerning to  you. Please understand that not all results are received at the same time and often the doctors may need to interpret multiple results in order to provide you with the best plan of care or course of treatment. Therefore, we ask that you please give [place] 2 business days to thoroughly review all your results before contacting the office for clarification. Should we see a critical lab result, you will be contacted sooner.   If You Need Anything After Your Visit  If you have any questions or concerns for your doctor, please call our main line at [phone] and press option 4 to reach your doctor's medical assistant. If no one answers, please leave a voicemail as directed and we will return your call as soon as possible. Messages left after 4 pm will be answered the following business day.   You may also send [place] a message via MyChart. We typically respond to MyChart messages within 1-2 business days.  For prescription refills, please ask your pharmacy to contact our office. Our fax number is [phone].  If you have an urgent issue when the clinic is closed that cannot wait until the next business day, you can page your doctor at the number below.    Please note that while we do our best to be available for urgent issues outside of office hours, we are not available 24/7.   If you have an urgent issue and are unable to reach [place], you may choose  to seek medical care at your doctor's office, retail clinic, urgent care center, or emergency room.  If you have a medical emergency, please immediately call 911 or go to the emergency department.  Pager Numbers  - Dr. Gwen Pounds: [phone]  - Dr. Roseanne Reno: [phone]  - Dr. Katrinka Blazing: [phone]   In the event of inclement weather, please call our main line at [phone] for an update on the status of any delays or closures.  Dermatology Medication Tips: Please keep the boxes that topical medications come in in order to help keep track of the  instructions about where and how to use these. Pharmacies typically print the medication instructions only on the boxes and not directly on the medication tubes.   If your medication is too expensive, please contact our office at [phone] option 4 or send [place] a message through MyChart.   We are unable to tell what your co-pay for medications will be in advance as this is different depending on your insurance coverage. However, we may be able to find a substitute medication at lower cost or fill out paperwork to get insurance to cover a needed medication.   If a prior authorization is required to get your medication covered by your insurance company, please allow [place] 1-2 business days to complete this process.  Drug prices often vary depending on where the prescription is filled and some pharmacies may offer cheaper prices.  The website www.goodrx.com contains coupons for medications through different pharmacies. The prices here do not account for what the cost may be with help from insurance (it may be cheaper with your insurance), but the website can give you the price if you did not use any insurance.  - You can print the associated coupon and take it with your prescription to the pharmacy.  - You may also stop by our office during regular business hours and pick up a GoodRx coupon card.  - If you need your prescription sent electronically to a different pharmacy, notify our office through [facility] or by phone at [phone] option 4.     Si Usted Necesita Algo Despus de Su Visita  Tambin puede enviarnos un mensaje a travs de Clinical cytogeneticist. Por lo general respondemos a los mensajes de MyChart en el transcurso de 1 a 2 das hbiles.  Para renovar recetas, por favor pida a su farmacia que se ponga en contacto con nuestra oficina. Annie Sable de fax es [place] [phone].  Si tiene un asunto urgente cuando la clnica est cerrada y que no puede esperar hasta el siguiente da hbil,  puede llamar/localizar a su doctor(a) al nmero que aparece a continuacin.   Por favor, tenga en cuenta que aunque hacemos todo lo posible para estar disponibles para asuntos urgentes fuera del horario de [place], no estamos disponibles las 24 horas del da, los 7 [address]  [address] de la [place].   Si tiene un problema urgente y no puede comunicarse con nosotros, puede optar por buscar atencin mdica  en el consultorio de su doctor(a), en una clnica privada, en un centro de atencin urgente o en una sala de emergencias.  Si tiene Engineer, drilling, por favor llame inmediatamente al 911 o vaya a la sala de emergencias.  Nmeros de bper  - Dr. Gwen Pounds: [phone]  - Dra. Roseanne Reno: [phone]  - Dr. Katrinka Blazing: [phone]   En caso de inclemencias del tiempo, por favor llame a Lacy Duverney principal al [phone] para una actualizacin sobre el [place] de cualquier retraso o cierre.  Consejos para la medicacin en dermatologa: Por favor, guarde las cajas en las que vienen los medicamentos de uso tpico para ayudarle a seguir las instrucciones sobre dnde y cmo usarlos. Las farmacias generalmente imprimen las instrucciones del medicamento slo en las cajas y no directamente en los tubos del [place].   Si su medicamento es muy caro, por favor, pngase en contacto con Rolm Gala llamando al [phone] y presione la opcin 4 o envenos un mensaje a travs de Clinical cytogeneticist.   No podemos decirle cul ser su copago por los medicamentos por adelantado ya que esto es diferente dependiendo de la cobertura de su seguro. Sin embargo, es posible que podamos encontrar un medicamento sustituto a Audiological scientist un formulario para que el seguro cubra el medicamento que se considera necesario.   Si se requiere una autorizacin previa para que su compaa de seguros [place] su medicamento, por favor permtanos de 1 a 2 das hbiles para completar [address]  Los precios de los medicamentos varan  con frecuencia dependiendo del Environmental consultant de dnde se surte la receta y alguna farmacias pueden ofrecer precios ms baratos.  El sitio web www.goodrx.com tiene cupones para medicamentos de Health and safety inspector. Los precios aqu no tienen en cuenta lo que podra costar con la ayuda del seguro (puede ser ms barato con su seguro), pero el sitio web puede darle el precio si no utiliz Tourist information centre manager.  - Puede imprimir el cupn correspondiente y llevarlo con su receta a la farmacia.  - Tambin puede pasar por nuestra oficina durante el horario de atencin regular y Education officer, museum una tarjeta de cupones de GoodRx.  - Si necesita que su receta se enve electrnicamente a una farmacia diferente, informe a nuestra oficina a travs de MyChart de [place] o por telfono llamando al [phone] y presione la opcin 4.

## 2023-04-05 NOTE — Patient Instructions (Signed)
Iron-Rich Diet  Iron is a mineral that helps your body produce hemoglobin. Hemoglobin is a protein in red blood cells that carries oxygen to your body's tissues. Eating too little iron may cause you to feel weak and tired, and it can increase your risk of infection. Iron is naturally found in many foods, and many foods have iron added to them (are iron-fortified). You may need to follow an iron-rich diet if you do not have enough iron in your body due to certain medical conditions. The amount of iron that you need each day depends on your age, your sex, and any medical conditions you have. Follow instructions from your health care provider or a dietitian about how much iron you should eat each day. What are tips for following this plan? Reading food labels Check food labels to see how many milligrams (mg) of iron are in each serving. Cooking Cook foods in pots and pans that are made from iron. Take these steps to make it easier for your body to absorb iron from certain foods: Soak beans overnight before cooking. Soak whole grains overnight and drain them before using. Ferment flours before baking, such as by using yeast in bread dough. Meal planning When you eat foods that contain iron, you should eat them with foods that are high in vitamin C. These include oranges, peppers, tomatoes, potatoes, and mangoes. Vitamin C helps your body absorb iron. Certain foods and drinks prevent your body from absorbing iron properly. Avoid eating these foods in the same meal as iron-rich foods or with iron supplements. These foods include: Coffee, black tea, and red wine. Milk, dairy products, and foods that are high in calcium. Beans and soybeans. Whole grains. General information Take iron supplements only as told by your health care provider. An overdose of iron can be life-threatening. If you were prescribed iron supplements, take them with orange juice or a vitamin C supplement. When you eat  iron-fortified foods or take an iron supplement, you should also eat foods that naturally contain iron, such as meat, poultry, and fish. Eating naturally iron-rich foods helps your body absorb the iron that is added to other foods or contained in a supplement. Iron from animal sources is better absorbed than iron from plant sources. What foods should I eat? Fruits Prunes. Raisins. Eat fruits high in vitamin C, such as oranges, grapefruits, and strawberries, with iron-rich foods. Vegetables Spinach (cooked). Green peas. Broccoli. Fermented vegetables. Eat vegetables high in vitamin C, such as leafy greens, potatoes, bell peppers, and tomatoes, with iron-rich foods. Grains Iron-fortified breakfast cereal. Iron-fortified whole-wheat bread. Enriched rice. Sprouted grains. Meats and other proteins Beef liver. Beef. [place]. Chicken. Oysters. Shrimp. Tuna. Sardines. Chickpeas. Nuts. Tofu. Pumpkin seeds. Beverages Tomato juice. Fresh orange juice. Prune juice. Hibiscus tea. Iron-fortified instant breakfast shakes. Sweets and desserts Blackstrap molasses. Seasonings and condiments Tahini. Fermented soy sauce. Other foods Wheat germ. The items listed above may not be a complete list of recommended foods and beverages. Contact a dietitian for more information. What foods should I limit? These are foods that should be limited while eating iron-rich foods as they can reduce the absorption of iron in your body. Grains Whole grains. Bran cereal. Bran flour. Meats and other proteins Soybeans. Products made from soy protein. Black beans. Lentils. Mung beans. Split peas. Dairy Milk. Cream. Cheese. Yogurt. Cottage cheese. Beverages Coffee. Black tea. Red wine. Sweets and desserts Cocoa. Chocolate. Ice cream. Seasonings and condiments Basil. Oregano. Large amounts of parsley. The items listed  above may not be a complete list of foods and beverages you should limit. Contact a dietitian for more  information. Summary Iron is a mineral that helps your body produce hemoglobin. Hemoglobin is a protein in red blood cells that carries oxygen to your body's tissues. Iron is naturally found in many foods, and many foods have iron added to them (are iron-fortified). When you eat foods that contain iron, you should eat them with foods that are high in vitamin C. Vitamin C helps your body absorb iron. Certain foods and drinks prevent your body from absorbing iron properly, such as whole grains and dairy products. You should avoid eating these foods in the same meal as iron-rich foods or with iron supplements. This information is not intended to replace advice given to you by your health care provider. Make sure you discuss any questions you have with your health care provider. Document Revised: 06/21/2020 Document Reviewed: 06/21/2020 Elsevier Patient Education  2024 [employer].

## 2023-04-06 ENCOUNTER — Encounter: Payer: Self-pay | Admitting: Registered Nurse

## 2023-04-06 NOTE — Progress Notes (Signed)
Subjective:    Patient ID: Cheyenne Hubbard, female    DOB: May 10, 1966, 57 y.o.   MRN: [id number]  57y/o african [place] female established patient here to discuss lab results.  Stated read my chart message and would like to know further iron rich foods.  Stated exhausted after work and cooking for her family typically doesn't eat much but milk, cassava leaves, or fruits.  Does not have much meat/bean/nut intake.  Denied menstrual loss of blood.  PCM talked to her about colonoscopy last year but she did not schedule.      Review of Systems  Constitutional:  Positive for fatigue.  HENT:  Negative for trouble swallowing and voice change.   Eyes:  Negative for photophobia and visual disturbance.  Respiratory:  Negative for shortness of breath.   Cardiovascular:  Negative for chest pain.  Gastrointestinal:  Negative for abdominal pain and blood in stool.  Genitourinary:  Negative for difficulty urinating.  Musculoskeletal:  Negative for gait problem.  Skin:  Negative for rash.  Neurological:  Negative for headaches.  Psychiatric/Behavioral:  Negative for agitation, confusion and sleep disturbance.        Objective:   Physical Exam Vitals reviewed.  Constitutional:      General: She is awake. She is not in acute distress.    Appearance: Normal appearance. She is well-developed, well-groomed and overweight. She is not ill-appearing, toxic-appearing or diaphoretic.  HENT:     Head: Normocephalic and atraumatic.     Jaw: There is normal jaw occlusion.     Salivary Glands: Right salivary gland is not diffusely enlarged. Left salivary gland is not diffusely enlarged.     Right Ear: Hearing and external ear normal.     Left Ear: Hearing and external ear normal.     Nose: Nose normal. No congestion or rhinorrhea.     Mouth/Throat:     Lips: Pink. No lesions.     Mouth: Mucous membranes are moist.     Pharynx: Oropharynx is clear.  Eyes:     General: Lids are normal. Vision  grossly intact. Gaze aligned appropriately. No scleral icterus.       Right eye: No discharge.        Left eye: No discharge.     Extraocular Movements: Extraocular movements intact.     Conjunctiva/sclera: Conjunctivae normal.     Pupils: Pupils are equal, round, and reactive to light.  Neck:     Trachea: Trachea and phonation normal.  Cardiovascular:     Rate and Rhythm: Normal rate and regular rhythm.  Pulmonary:     Effort: Pulmonary effort is normal.     Breath sounds: Normal breath sounds and air entry. No stridor or transmitted upper airway sounds. No wheezing.     Comments: Spoke full sentences without difficulty; no cough observed in exam room Abdominal:     General: Abdomen is flat.  Musculoskeletal:        General: Normal range of motion.     Right hand: Normal strength. Normal capillary refill.     Left hand: Normal strength. Normal capillary refill.     Cervical back: Normal range of motion and neck supple. No edema, erythema, signs of trauma, rigidity, torticollis or crepitus. No pain with movement. Normal range of motion.  Lymphadenopathy:     Head:     Right side of head: No submandibular or preauricular adenopathy.     Left side of head: No submandibular or preauricular adenopathy.  Cervical:     Right cervical: No superficial cervical adenopathy.    Left cervical: No superficial cervical adenopathy.  Skin:    General: Skin is warm and dry.     Capillary Refill: Capillary refill takes less than 2 seconds.     Coloration: Skin is not ashen, cyanotic, jaundiced, mottled, pale or sallow.     Findings: No abrasion, bruising, burn, erythema, signs of injury, laceration, petechiae, rash or wound.  Neurological:     General: No focal deficit present.     Mental Status: She is alert and oriented to person, place, and time. Mental status is at baseline.     GCS: GCS eye subscore is 4. GCS verbal subscore is 5. GCS motor subscore is 6.     Cranial Nerves: Cranial nerves  2-12 are intact. No cranial nerve deficit, dysarthria or facial asymmetry.     Motor: Motor function is intact. No weakness, tremor, atrophy, abnormal muscle tone or seizure activity.     Coordination: Coordination is intact. Coordination normal.     Gait: Gait is intact. Gait normal.     Comments: In/out of chair without difficulty; gait sure and steady in clinic; bilateral hand grasp equal 5/5  Psychiatric:        Attention and Perception: Attention and perception normal.        Mood and Affect: Mood and affect normal.        Speech: Speech normal.        Behavior: Behavior normal. Behavior is cooperative.        Thought Content: Thought content normal.        Cognition and Memory: Cognition and memory normal.        Judgment: Judgment normal.   Discussed lab results in detail with patient verbally and given printouts.  Latest Reference Range & Units 09/21/22 07:53 03/28/23 11:40  Sodium 134 - 144 mmol/L 141   Potassium 3.5 - 5.2 mmol/L 4.1   Chloride 96 - 106 mmol/L 104   Glucose 70 - 99 mg/dL 93   BUN 6 - 24 mg/dL 7   Creatinine 1.61 - 0.96 mg/dL 0.45   Calcium 8.7 - 40.9 mg/dL 9.4   BUN/Creatinine Ratio 9 - 23  10   eGFR >59 mL/min/1.73 99   Phosphorus 3.0 - 4.3 mg/dL 3.3   Alkaline Phosphatase 44 - 121 IU/L 74   Albumin 3.8 - 4.9 g/dL 4.1   Albumin/Globulin Ratio 1.2 - 2.2  1.5   Uric Acid 3.0 - 7.2 mg/dL 5.2   AST 0 - 40 IU/L 16   ALT 0 - 32 IU/L 12   Total Protein 6.0 - 8.5 g/dL 6.9   Total Bilirubin 0.0 - 1.2 mg/dL 0.3   GGT 0 - 60 IU/L 11   Estimated CHD Risk 0.0 - 1.0 times avg.  < 0.5   LDH 119 - 226 IU/L 170   Total CHOL/HDL Ratio 0.0 - 4.4 ratio 2.1   Cholesterol, Total 100 - 199 mg/dL 811   HDL Cholesterol >91 mg/dL 61   Triglycerides 0 - 149 mg/dL 41   VLDL Cholesterol Cal 5 - 40 mg/dL 10   LDL Chol Calc (NIH) 0 - 99 mg/dL 56   Iron 27 - 478 ug/dL 69   Vitamin D, 29-FAOZHYQ 30.0 - 100.0 ng/mL 39.6   Globulin, Total 1.5 - 4.5 g/dL 2.8   WBC 3.4 - 65.7  Q46N6/EX 4.1 4.2  RBC 3.77 - 5.28 x10E6/uL 5.25 5.11  Hemoglobin 11.1 -  15.9 g/dL 29.5 62.1  HCT 30.8 - 65.7 % 39.8 38.8  MCV 79 - 97 fL 76 (L) 76 (L)  MCH 26.6 - 33.0 pg 21.9 (L) 22.1 (L)  MCHC 31.5 - 35.7 g/dL 84.6 (L) 96.2 (L)  RDW 11.7 - 15.4 % 14.7 15.8 (H)  Platelets 150 - 450 x10E3/uL 342 362  Neutrophils Not Estab. % 51 51  Immature Granulocytes Not Estab. % 0 0  NEUT# 1.4 - 7.0 x10E3/uL 2.1 2.1  Lymphocyte # 0.7 - 3.1 x10E3/uL 1.6 1.7  Monocytes Absolute 0.1 - 0.9 x10E3/uL 0.3 0.3  Basophils Absolute 0.0 - 0.2 x10E3/uL 0.0 0.0  Immature Grans (Abs) 0.0 - 0.1 x10E3/uL 0.0 0.0  Lymphs Not Estab. % 38 39  Monocytes Not Estab. % 8 8  Basos Not Estab. % 1 1  Eos Not Estab. % 2 1  EOS (ABSOLUTE) 0.0 - 0.4 x10E3/uL 0.1 0.1  Hemoglobin A1C 4.8 - 5.6 % 5.6   Est. average glucose Bld gHb Est-mCnc mg/dL 952   TSH 8.413 - 2.440 uIU/mL 2.590   Thyroxine (T4) 4.5 - 12.0 ug/dL 7.8   Free Thyroxine Index 1.2 - 4.9  2.0   T3 Uptake Ratio 24 - 39 % 26   (L): Data is abnormally low (H): Data is abnormally high       Assessment & Plan:  A-chronic fatigue  P-given printout iron rich diet exitcare and lab results/instructions.  Discussed recommendations to try to increase iron in diet e.g. spinach, meat is her preference over beans/nuts/multivitamin.  Discussed scheduling her colonoscopy or asking for alternative colon cancer screening from [facility].  Low normal hemoglobin and slight worsening since last recheck.  Last iron 69.  TSH normal.  Hgba1c high normal.  Next recheck labs with [facility] in 2025.  Patient does think anemia related to her poor diet and will try to make healthier choices.  Patient agreed with plan of care and had no further questions at this time.

## 2023-05-23 ENCOUNTER — Ambulatory Visit (INDEPENDENT_AMBULATORY_CARE_PROVIDER_SITE_OTHER): Payer: No Typology Code available for payment source | Admitting: Dermatology

## 2023-05-23 DIAGNOSIS — Z7189 Other specified counseling: Secondary | ICD-10-CM

## 2023-05-23 DIAGNOSIS — Z79899 Other long term (current) drug therapy: Secondary | ICD-10-CM | POA: Diagnosis not present

## 2023-05-23 DIAGNOSIS — L811 Chloasma: Secondary | ICD-10-CM | POA: Diagnosis not present

## 2023-05-23 DIAGNOSIS — L409 Psoriasis, unspecified: Secondary | ICD-10-CM | POA: Diagnosis not present

## 2023-05-23 DIAGNOSIS — L299 Pruritus, unspecified: Secondary | ICD-10-CM | POA: Diagnosis not present

## 2023-05-23 MED ORDER — OTEZLA 30 MG PO TABS: 30.0000 mg | ORAL_TABLET | Freq: Two times a day (BID) | ORAL | 4 refills | Status: DC

## 2023-05-23 NOTE — Progress Notes (Signed)
Follow-Up Visit   Subjective  Cheyenne Hubbard is a 57 y.o. female who presents for the following: 6 week psoriasis follow up.  The patient has spots, moles and lesions to be evaluated, some may be new or changing and the patient may have concern these could be cancer.   The following portions of the chart were reviewed this encounter and updated as appropriate: medications, allergies, medical history  Review of Systems:  No other skin or systemic complaints except as noted in HPI or Assessment and Plan.  Objective  Well appearing patient in no apparent distress; mood and affect are within normal limits.  A focused examination was performed of the following areas: B/l legs,  face   Relevant exam findings are noted in the Assessment and Plan.    Assessment & Plan    PSORIASIS With pruritus Exam: Well-demarcated erythematous papules/plaques with silvery scale, guttate pink scaly papules. % BSA.   Chronic and persistent condition with duration or expected duration over one year. Condition is bothersome/symptomatic for patient. Currently flared.     patient denies joint pain   Psoriasis is a chronic non-curable, but treatable genetic/hereditary disease that may have other systemic features affecting other organ systems such as joints (Psoriatic Arthritis). It is associated with an increased risk of inflammatory bowel disease, heart disease, non-alcoholic fatty liver disease, and depression.  Treatments include light and laser treatments; topical medications; and systemic medications including oral and injectables.   Treatment Plan: Patient denies depression, diarrhea, headaches or respiratory problems while on medication  Restart Otezla samples as directed. Take 10 mg tablet once a day until gone, then take 20 mg tablet once a day until gone, then take 30 mg tablet once a day until you come back to our office.    Lot: [id number] Exp: 01/20/2025  Sent Rx to [facility] to see  if patient is approved for coverage.  Patient advised to contact us and let us know if she is unable to get medication covered.  Will submit forms for patient assistance  Send to pharmacy coverage  Side effects of Otezla (apremilast) include diarrhea, nausea, headache, upper respiratory infection, depression, and weight decrease (5-10%). It should only be taken by pregnant women after a discussion regarding risks and benefits with their doctor. Goal is control of skin condition, not cure.  The use of [facility] [place] requires long term medication management, including periodic office visits.     If not improving with [facility] [place] will consider other biologic medication.  Psoriasis  Related Medications Apremilast (OTEZLA) 30 MG TABS Take 1 tablet (30 mg total) by mouth 2 (two) times daily.   MELASMA At forehead and cheeks Exam: reticulated hyperpigmented patches at face see photos   Chronic and persistent condition with duration or expected duration over one year. Condition is bothersome/symptomatic for patient. Currently flared.   Melasma is a chronic; persistent condition of hyperpigmented patches generally on the face, worse in summer due to higher UV exposure.    Heredity; thyroid disease; sun exposure; pregnancy; birth control pills; epilepsy medication and darker skin may predispose to Melasma.   Recommendations include: - Sun avoidance and daily broad spectrum (UVA/UVB) tinted mineral sunscreen SPF 30+, with Zinc or Titanium Dioxide. - Rx topical bleaching creams (i.e. hydroquinone) is a common treatment but should not be used long term.  Hydroquinones may be mixed with retinoids; vitamin C; steroids; Kojic Acid. - Alastin A-luminate, retinoids, vitamin C, topical tranexamic acid, glycolic acid and kojic acid can be used  for brightening while on break from hydroquinone - Rx Azelaic Acid is also a treatment option that is safe for pregnancy (Category B). - OTC Heliocare can be helpful in control  and prevention. - Oral Rx with Tranexamic Acid 250 mg - 650 mg po daily can be used for moderate to severe cases especially during summer (contraindications include pregnancy; lactation; hx of PE; hx of DVT; clotting disorder; heart disease; anticoagulant use and upcoming long trips)   - Chemical peels (would need multiple for best result).  - Lasers and  Microdermabrasion may also be helpful adjunct treatments.  Treatment Plan:  Start [facility] Hydroquinone: 12% Kojic Acid: 6% Niacinamide: 2% Vitamin C: 1% Cream - apply topically to affected areas once daily for 3 months then stop.   Instructions for [facility] Medications  One or more of your medications was sent to the [facility] mail order compounding pharmacy. You will receive an email from them and can purchase the medicine through that link. It will then be mailed to your home at the address you confirmed. If for any reason you do not receive an email from them, please check your spam folder. If you still do not find the email, please let us know. [facility] phone number is [phone].    Return for late february for psoriasis followup.  IAsher Muir, CMA, am acting as scribe for Armida Sans, MD.   Documentation: I have reviewed the above documentation for accuracy and completeness, and I agree with the above.  Armida Sans, MD

## 2023-05-23 NOTE — Patient Instructions (Addendum)
Side effects of Otezla (apremilast) include diarrhea, nausea, headache, upper respiratory infection, depression, and weight decrease (5-10%). It should only be taken by pregnant women after a discussion regarding risks and benefits with their doctor. Goal is control of skin condition, not cure.  The use of [place] requires long term medication management, including periodic office visits.     Melasma is a chronic; persistent condition of hyperpigmented patches generally on the face, worse in summer due to higher UV exposure.    Heredity; thyroid disease; sun exposure; pregnancy; birth control pills; epilepsy medication and darker skin may predispose to Melasma.    Recommendations include: - Sun avoidance and daily broad spectrum (UVA/UVB) tinted mineral sunscreen SPF 30+, with Zinc or Titanium Dioxide. - Rx topical bleaching creams (i.e. hydroquinone) is a common treatment but should not be used long term.  Hydroquinones may be mixed with retinoids; vitamin C; steroids; Kojic Acid. - Alastin A-luminate, retinoids, vitamin C, topical tranexamic acid, glycolic acid and kojic acid can be used for brightening while on break from hydroquinone - Rx Azelaic Acid is also a treatment option that is safe for pregnancy (Category B). - OTC Heliocare can be helpful in control and prevention. - Oral Rx with Tranexamic Acid 250 mg - 650 mg po daily can be used for moderate to severe cases especially during summer (contraindications include pregnancy; lactation; hx of PE; hx of DVT; clotting disorder; heart disease; anticoagulant use and upcoming long trips)   - Chemical peels (would need multiple for best result).  - Lasers and  Microdermabrasion may also be helpful adjunct treatments.   Start hydroquinone 12% cream mix from [facility] - apply to face once daily for 3 months then stop   Instructions for [facility] Medications  One or more of your medications was sent to the [facility] mail  order compounding pharmacy. You will receive an email from them and can purchase the medicine through that link. It will then be mailed to your home at the address you confirmed. If for any reason you do not receive an email from them, please check your spam folder. If you still do not find the email, please let us know. [facility] phone number is [phone].       Due to recent changes in healthcare laws, you may see results of your pathology and/or laboratory studies on MyChart before the doctors have had a chance to review them. We understand that in some cases there may be results that are confusing or concerning to you. Please understand that not all results are received at the same time and often the doctors may need to interpret multiple results in order to provide you with the best plan of care or course of treatment. Therefore, we ask that you please give [place] 2 business days to thoroughly review all your results before contacting the office for clarification. Should we see a critical lab result, you will be contacted sooner.   If You Need Anything After Your Visit  If you have any questions or concerns for your doctor, please call our main line at [phone] and press option 4 to reach your doctor's medical assistant. If no one answers, please leave a voicemail as directed and we will return your call as soon as possible. Messages left after 4 pm will be answered the following business day.   You may also send [place] a message via MyChart. We typically respond to MyChart messages within 1-2 business days.  For prescription refills,  please ask your pharmacy to contact our office. Our fax number is [phone].  If you have an urgent issue when the clinic is closed that cannot wait until the next business day, you can page your doctor at the number below.    Please note that while we do our best to be available for urgent issues outside of office hours, we are not available 24/7.    If you have an urgent issue and are unable to reach [place], you may choose to seek medical care at your doctor's office, retail clinic, urgent care center, or emergency room.  If you have a medical emergency, please immediately call 911 or go to the emergency department.  Pager Numbers  - Dr. Gwen Pounds: [phone]  - Dr. Roseanne Reno: [phone]  - Dr. Katrinka Blazing: [phone]   In the event of inclement weather, please call our main line at [phone] for an update on the status of any delays or closures.  Dermatology Medication Tips: Please keep the boxes that topical medications come in in order to help keep track of the instructions about where and how to use these. Pharmacies typically print the medication instructions only on the boxes and not directly on the medication tubes.   If your medication is too expensive, please contact our office at [phone] option 4 or send [place] a message through MyChart.   We are unable to tell what your co-pay for medications will be in advance as this is different depending on your insurance coverage. However, we may be able to find a substitute medication at lower cost or fill out paperwork to get insurance to cover a needed medication.   If a prior authorization is required to get your medication covered by your insurance company, please allow [place] 1-2 business days to complete this process.  Drug prices often vary depending on where the prescription is filled and some pharmacies may offer cheaper prices.  The website www.goodrx.com contains coupons for medications through different pharmacies. The prices here do not account for what the cost may be with help from insurance (it may be cheaper with your insurance), but the website can give you the price if you did not use any insurance.  - You can print the associated coupon and take it with your prescription to the pharmacy.  - You may also stop by our office during regular business hours and pick up a  GoodRx coupon card.  - If you need your prescription sent electronically to a different pharmacy, notify our office through [facility] or by phone at [phone] option 4.     Si Usted Necesita Algo Despus de Su Visita  Tambin puede enviarnos un mensaje a travs de Clinical cytogeneticist. Por lo general respondemos a los mensajes de MyChart en el transcurso de 1 a 2 das hbiles.  Para renovar recetas, por favor pida a su farmacia que se ponga en contacto con nuestra oficina. Annie Sable de fax es [place] [phone].  Si tiene un asunto urgente cuando la clnica est cerrada y que no puede esperar hasta el siguiente da hbil, puede llamar/localizar a su doctor(a) al nmero que aparece a continuacin.   Por favor, tenga en cuenta que aunque hacemos todo lo posible para estar disponibles para asuntos urgentes fuera del horario de [place], no estamos disponibles las 24 horas del da, los 7 [address]  [address] de la [place].   Si tiene un problema urgente y no puede comunicarse con nosotros, puede optar por buscar atencin Sports administrator  de su doctor(a), en una clnica privada, en un centro de atencin urgente o en una sala de emergencias.  Si tiene Engineer, drilling, por favor llame inmediatamente al 911 o vaya a la sala de emergencias.  Nmeros de bper  - Dr. Gwen Pounds: [phone]  - Dra. Roseanne Reno: [phone]  - Dr. Katrinka Blazing: [phone]   En caso de inclemencias del tiempo, por favor llame a Lacy Duverney principal al [phone] para una actualizacin sobre el [place] de cualquier retraso o cierre.  Consejos para la medicacin en dermatologa: Por favor, guarde las cajas en las que vienen los medicamentos de uso tpico para ayudarle a seguir las instrucciones sobre dnde y cmo usarlos. Las farmacias generalmente imprimen las instrucciones del medicamento slo en las cajas y no directamente en los tubos del [place].   Si su medicamento es muy caro, por favor, pngase en contacto  con Rolm Gala llamando al [phone] y presione la opcin 4 o envenos un mensaje a travs de Clinical cytogeneticist.   No podemos decirle cul ser su copago por los medicamentos por adelantado ya que esto es diferente dependiendo de la cobertura de su seguro. Sin embargo, es posible que podamos encontrar un medicamento sustituto a Audiological scientist un formulario para que el seguro cubra el medicamento que se considera necesario.   Si se requiere una autorizacin previa para que su compaa de seguros [place] su medicamento, por favor permtanos de 1 a 2 das hbiles para completar [address]  Los precios de los medicamentos varan con frecuencia dependiendo del Environmental consultant de dnde se surte la receta y alguna farmacias pueden ofrecer precios ms baratos.  El sitio web www.goodrx.com tiene cupones para medicamentos de Health and safety inspector. Los precios aqu no tienen en cuenta lo que podra costar con la ayuda del seguro (puede ser ms barato con su seguro), pero el sitio web puede darle el precio si no utiliz Tourist information centre manager.  - Puede imprimir el cupn correspondiente y llevarlo con su receta a la farmacia.  - Tambin puede pasar por nuestra oficina durante el horario de atencin regular y Education officer, museum una tarjeta de cupones de GoodRx.  - Si necesita que su receta se enve electrnicamente a una farmacia diferente, informe a nuestra oficina a travs de MyChart de [place] o por telfono llamando al [phone] y presione la opcin 4.

## 2023-06-03 ENCOUNTER — Encounter: Payer: Self-pay | Admitting: Dermatology

## 2023-06-04 ENCOUNTER — Telehealth: Payer: Self-pay

## 2023-06-04 NOTE — Telephone Encounter (Addendum)
Tried calling patient regarding providers request below. No answer. LMOM for patient to return call.   ----- Message from Armida Sans sent at 06/03/2023  6:16 PM EST ----- Please contact patient to see if she was able to get Otezla filled. Also see if she was able to get assistance. Also ask if she is improving with her treatment assuming she is taking [place]. Make sure she keeps her follow-up. Please document. Thanks

## 2023-06-05 ENCOUNTER — Telehealth: Payer: Self-pay

## 2023-06-05 NOTE — Telephone Encounter (Addendum)
Tried calling patient again regarding Cheyenne Hubbard and to see how she is doing on medication. Patient did not answer. LM for patient to return call.   ----- Message from Armida Sans sent at 06/04/2023  5:26 PM EST ----- Please call pt again tomorrow. ----- Message ----- From: Deirdre Evener, MD Sent: 06/03/2023   6:17 PM EST To: Deirdre Evener, MD; Marilynn Rail, CMA  Please contact patient to see if she was able to get Otezla filled. Also see if she was able to get assistance. Also ask if she is improving with her treatment assuming she is taking [place]. Make sure she keeps her follow-up. Please document. Thanks

## 2023-06-19 ENCOUNTER — Encounter: Payer: Self-pay | Admitting: Dermatology

## 2023-07-16 ENCOUNTER — Telehealth: Payer: Self-pay

## 2023-07-16 NOTE — Telephone Encounter (Signed)
Left message regarding AOR form needing to be signed from [place].   Left message with Holiday hours and form left on my desk. aw

## 2023-08-07 ENCOUNTER — Other Ambulatory Visit: Payer: Self-pay | Admitting: Nurse Practitioner

## 2023-08-07 DIAGNOSIS — Z1231 Encounter for screening mammogram for malignant neoplasm of breast: Secondary | ICD-10-CM

## 2023-08-22 ENCOUNTER — Ambulatory Visit
Admission: RE | Admit: 2023-08-22 | Discharge: 2023-08-22 | Disposition: A | Discharge status: Home / Self Care | Payer: No Typology Code available for payment source | Source: Ambulatory Visit | Attending: Nurse Practitioner

## 2023-08-22 DIAGNOSIS — Z1231 Encounter for screening mammogram for malignant neoplasm of breast: Secondary | ICD-10-CM

## 2023-09-19 ENCOUNTER — Ambulatory Visit: Payer: No Typology Code available for payment source | Admitting: Dermatology

## 2023-10-10 ENCOUNTER — Other Ambulatory Visit: Payer: Self-pay

## 2023-10-10 DIAGNOSIS — L409 Psoriasis, unspecified: Secondary | ICD-10-CM

## 2023-10-10 MED ORDER — OTEZLA 30 MG PO TABS: 30.0000 mg | ORAL_TABLET | Freq: Two times a day (BID) | ORAL | 4 refills | Status: DC

## 2023-11-28 ENCOUNTER — Other Ambulatory Visit: Payer: Self-pay

## 2023-11-28 VITALS — BP 122/84 | Ht 64.0 in | Wt 186.0 lb

## 2023-11-28 DIAGNOSIS — Z0000 Encounter for general adult medical examination without abnormal findings: Secondary | ICD-10-CM

## 2023-11-28 NOTE — Progress Notes (Signed)
 Fasting Be Well Labs drawn

## 2023-11-29 ENCOUNTER — Encounter: Payer: Self-pay | Admitting: Registered Nurse

## 2023-11-29 LAB — CMP12+LP+TP+TSH+6AC+CBC/D/PLT
ALT: 15 IU/L (ref 0–32)
AST: 16 IU/L (ref 0–40)
Albumin: 4.5 g/dL (ref 3.8–4.9)
Alkaline Phosphatase: 63 IU/L (ref 44–121)
BUN/Creatinine Ratio: 16 (ref 9–23)
BUN: 10 mg/dL (ref 6–24)
Basophils Absolute: 0.1 10*3/uL (ref 0.0–0.2)
Basos: 1 %
Bilirubin Total: 0.4 mg/dL (ref 0.0–1.2)
Calcium: 9.6 mg/dL (ref 8.7–10.2)
Chloride: 104 mmol/L (ref 96–106)
Chol/HDL Ratio: 2.9 ratio (ref 0.0–4.4)
Cholesterol, Total: 155 mg/dL (ref 100–199)
Creatinine, Ser: 0.63 mg/dL (ref 0.57–1.00)
EOS (ABSOLUTE): 0.1 10*3/uL (ref 0.0–0.4)
Eos: 2 %
Estimated CHD Risk: <0.5 times avg. (ref 0.0–1.0)
Free Thyroxine Index: 2.1 (ref 1.2–4.9)
GGT: 13 IU/L (ref 0–60)
Globulin, Total: 2.7 g/dL (ref 1.5–4.5)
Glucose: 76 mg/dL (ref 70–99)
HDL: 54 mg/dL (ref >39)
Hematocrit: 38.9 % (ref 34.0–46.6)
Hemoglobin: 11.1 g/dL (ref 11.1–15.9)
Immature Grans (Abs): 0 10*3/uL (ref 0.0–0.1)
Immature Granulocytes: 0 %
Iron: 61 ug/dL (ref 27–159)
LDH: 155 IU/L (ref 119–226)
LDL Chol Calc (NIH): 90 mg/dL (ref 0–99)
Lymphocytes Absolute: 1.8 10*3/uL (ref 0.7–3.1)
Lymphs: 39 %
MCH: 21.1 pg — ABNORMAL LOW (ref 26.6–33.0)
MCHC: 28.5 g/dL — ABNORMAL LOW (ref 31.5–35.7)
MCV: 74 fL — ABNORMAL LOW (ref 79–97)
Monocytes Absolute: 0.3 10*3/uL (ref 0.1–0.9)
Monocytes: 6 %
Neutrophils Absolute: 2.4 10*3/uL (ref 1.4–7.0)
Neutrophils: 52 %
Phosphorus: 3.9 mg/dL (ref 3.0–4.3)
Platelets: 371 10*3/uL (ref 150–450)
Potassium: 3.9 mmol/L (ref 3.5–5.2)
RBC: 5.26 x10E6/uL (ref 3.77–5.28)
RDW: 15.8 % — ABNORMAL HIGH (ref 11.7–15.4)
Sodium: 141 mmol/L (ref 134–144)
T3 Uptake Ratio: 26 % (ref 24–39)
T4, Total: 8.1 ug/dL (ref 4.5–12.0)
TSH: 1.38 u[IU]/mL (ref 0.450–4.500)
Total Protein: 7.2 g/dL (ref 6.0–8.5)
Triglycerides: 55 mg/dL (ref 0–149)
Uric Acid: 5.7 mg/dL (ref 3.0–7.2)
VLDL Cholesterol Cal: 11 mg/dL (ref 5–40)
WBC: 4.7 10*3/uL (ref 3.4–10.8)
eGFR: 104 mL/min/{1.73_m2} (ref >59)

## 2023-11-29 LAB — HEMOGLOBIN A1C
Est. average glucose Bld gHb Est-mCnc: 111 mg/dL
Hgb A1c MFr Bld: 5.5 % (ref 4.8–5.6)

## 2023-12-11 ENCOUNTER — Ambulatory Visit (INDEPENDENT_AMBULATORY_CARE_PROVIDER_SITE_OTHER): Admitting: Dermatology

## 2023-12-11 ENCOUNTER — Ambulatory Visit: Payer: No Typology Code available for payment source | Admitting: Dermatology

## 2023-12-11 ENCOUNTER — Encounter: Payer: Self-pay | Admitting: Dermatology

## 2023-12-11 DIAGNOSIS — L409 Psoriasis, unspecified: Secondary | ICD-10-CM | POA: Diagnosis not present

## 2023-12-11 DIAGNOSIS — L811 Chloasma: Secondary | ICD-10-CM

## 2023-12-11 DIAGNOSIS — Z7189 Other specified counseling: Secondary | ICD-10-CM

## 2023-12-11 DIAGNOSIS — Z79899 Other long term (current) drug therapy: Secondary | ICD-10-CM | POA: Diagnosis not present

## 2023-12-11 MED ORDER — BIMZELX 320 MG/2ML ~~LOC~~ SOAJ: 320.0000 mg | SUBCUTANEOUS | 1 refills | Status: AC

## 2023-12-11 NOTE — Progress Notes (Signed)
 Follow-Up Visit   Subjective  Cheyenne Hubbard is a 58 y.o. female who presents for the following: 6 months f/u on Psoriasis on her legs, buttock and thighs, treating with Otezla  2 tablets daily, patient report headaches and diarrhea from taking Otezla .   The following portions of the chart were reviewed this encounter and updated as appropriate: medications, allergies, medical history  Review of Systems:  No other skin or systemic complaints except as noted in HPI or Assessment and Plan.  Objective  Well appearing patient in no apparent distress; mood and affect are within normal limits.  Areas Examined:face,legs, thighs, back  Relevant exam findings are noted in the Assessment and Plan.               Assessment & Plan   PSORIASIS   Related Procedures Comprehensive metabolic panel with GFR CBC with Differential/Platelet Hepatitis B surface antibody,qualitative Hepatitis B surface antigen Hepatitis C antibody HIV Antibody (routine testing w rflx) QuantiFERON-TB Gold Plus COUNSELING AND COORDINATION OF CARE   MEDICATION MANAGEMENT   LONG-TERM USE OF HIGH-RISK MEDICATION   MELASMA    PSORIASIS Failed Otezla  due to inadequate results and side effects Legs, thighs, back Well-demarcated erythematous papules/plaques with silvery scale, guttate pink scaly papules, plaques are thinner and less scaly today. 20% BSA. Chronic and persistent condition with duration or expected duration over one year. Condition is symptomatic/ bothersome to patient. Not currently at goal.  Counseling on psoriasis and coordination of care  psoriasis is a chronic non-curable, but treatable genetic/hereditary disease that may have other systemic features affecting other organ systems such as joints (Psoriatic Arthritis). It is associated with an increased risk of inflammatory bowel disease, heart disease, non-alcoholic fatty liver disease, and depression.  Treatments include  light and laser treatments; topical medications; and systemic medications including oral and injectables.  Patient denies joint pain  Treatment Plan: D/C Otezla  tablets- due to side effects  Recommend staring Bimzelx  pending normal labs  Biologic start labs ordered   Reviewed risks of biologics including immunosuppression, infections, injection site reaction, and failure to improve condition. Goal is control of skin condition, not cure.  Some older biologics such as Humira and Enbrel may slightly increase risk of malignancy and may worsen congestive heart failure.  Taltz and Cosentyx may cause inflammatory bowel disease to flare. The use of biologics requires long term medication management, including periodic office visits and monitoring of blood work.    MELASMA At forehead and cheeks Exam: reticulated hyperpigmented patches at face see photos  Chronic and persistent condition with duration or expected duration over one year. Condition is bothersome/symptomatic for patient. Currently flared. Melasma is a chronic; persistent condition of hyperpigmented patches generally on the face, worse in summer due to higher UV exposure.    Heredity; thyroid  disease; sun exposure; pregnancy; birth control pills; epilepsy medication and darker skin may predispose to Melasma.   Recommendations include: - Sun avoidance and daily broad spectrum (UVA/UVB) tinted mineral sunscreen SPF 30+, with Zinc or Titanium Dioxide. - Rx topical bleaching creams (i.e. hydroquinone) is a common treatment but should not be used long term.  Hydroquinones may be mixed with retinoids; vitamin C; steroids; Kojic Acid. - Alastin A-luminate, retinoids, vitamin C, topical tranexamic acid, glycolic acid and kojic acid can be used for brightening while on break from hydroquinone - Rx Azelaic Acid is also a treatment option that is safe for pregnancy (Category B). - OTC Heliocare can be helpful in control and prevention. -  Oral Rx  with Tranexamic Acid 250 mg - 650 mg po daily can be used for moderate to severe cases especially during summer (contraindications include pregnancy; lactation; hx of PE; hx of DVT; clotting disorder; heart disease; anticoagulant use and upcoming long trips)   - Chemical peels (would need multiple for best result).  - Lasers and  Microdermabrasion may also be helpful adjunct treatments.  Stop  [facility] Hydroquinone: 12% Kojic Acid: 6% Niacinamide: 2% Vitamin C: 1% Cream -for 3 months then restart in September  Sunscreens daily!  Return to start Bimzelx pending normal labs.  IClara Crisp, CMA, am acting as scribe for [name] Collard, MD .   Documentation: I have reviewed the above documentation for accuracy and completeness, and I agree with the above.  [name] Collard, MD

## 2023-12-11 NOTE — Patient Instructions (Addendum)
 For face- discoloration  Stop  [facility] Hydroquinone: 12% Kojic Acid: 6% Niacinamide: 2% Vitamin C: 1% Cream  for 3 months then restart in September     Due to recent changes in healthcare laws, you may see results of your pathology and/or laboratory studies on MyChart before the doctors have had a chance to review them. We understand that in some cases there may be results that are confusing or concerning to you. Please understand that not all results are received at the same time and often the doctors may need to interpret multiple results in order to provide you with the best plan of care or course of treatment. Therefore, we ask that you please give us  2 business days to thoroughly review all your results before contacting the office for clarification. Should we see a critical lab result, you will be contacted sooner.   If You Need Anything After Your Visit  If you have any questions or concerns for your doctor, please call our main line at [phone] and press option 4 to reach your doctor's medical assistant. If no one answers, please leave a voicemail as directed and we will return your call as soon as possible. Messages left after 4 pm will be answered the following business day.   You may also send us  a message via MyChart. We typically respond to MyChart messages within 1-2 business days.  For prescription refills, please ask your pharmacy to contact our office. Our fax number is [phone].  If you have an urgent issue when the clinic is closed that cannot wait until the next business day, you can page your doctor at the number below.    Please note that while we do our best to be available for urgent issues outside of office hours, we are not available 24/7.   If you have an urgent issue and are unable to reach us , you may choose to seek medical care at your doctor's office, retail clinic, urgent care center, or emergency room.  If you have a medical emergency, please  immediately call 911 or go to the emergency department.  Pager Numbers  - Dr. Bary Likes: [phone]  - Dr. Annette Barters: [phone]  - Dr. Felipe Horton: [phone]   In the event of inclement weather, please call our main line at [phone] for an update on the status of any delays or closures.  Dermatology Medication Tips: Please keep the boxes that topical medications come in in order to help keep track of the instructions about where and how to use these. Pharmacies typically print the medication instructions only on the boxes and not directly on the medication tubes.   If your medication is too expensive, please contact our office at [phone] option 4 or send us  a message through MyChart.   We are unable to tell what your co-pay for medications will be in advance as this is different depending on your insurance coverage. However, we may be able to find a substitute medication at lower cost or fill out paperwork to get insurance to cover a needed medication.   If a prior authorization is required to get your medication covered by your insurance company, please allow us  1-2 business days to complete this process.  Drug prices often vary depending on where the prescription is filled and some pharmacies may offer cheaper prices.  The website www.goodrx.com contains coupons for medications through different pharmacies. The prices here do not account for what the cost may be with help from insurance (it may  be cheaper with your insurance), but the website can give you the price if you did not use any insurance.  - You can print the associated coupon and take it with your prescription to the pharmacy.  - You may also stop by our office during regular business hours and pick up a GoodRx coupon card.  - If you need your prescription sent electronically to a different pharmacy, notify our office through [facility] or by phone at [phone] option 4.     Si Usted Necesita Algo  Despus de Su Visita  Tambin puede enviarnos un mensaje a travs de Clinical cytogeneticist. Por lo general respondemos a los mensajes de MyChart en el transcurso de 1 a 2 das hbiles.  Para renovar recetas, por favor pida a su farmacia que se ponga en contacto con nuestra oficina. Franz Jacks de fax es [place] [phone].  Si tiene un asunto urgente cuando la clnica est cerrada y que no puede esperar hasta el siguiente da hbil, puede llamar/localizar a su doctor(a) al nmero que aparece a continuacin.   Por favor, tenga en cuenta que aunque hacemos todo lo posible para estar disponibles para asuntos urgentes fuera del horario de [place], no estamos disponibles las 24 horas del da, los 7 [address]  [address] de la [place].   Si tiene un problema urgente y no puede comunicarse con nosotros, puede optar por buscar atencin mdica  en el consultorio de su doctor(a), en una clnica privada, en un centro de atencin urgente o en una sala de emergencias.  Si tiene Engineer, drilling, por favor llame inmediatamente al 911 o vaya a la sala de emergencias.  Nmeros de bper  - Dr. Bary Likes: [phone]  - Dra. Annette Barters: [phone]  - Dr. Felipe Horton: [phone]   En caso de inclemencias del tiempo, por favor llame a Lajuan Pila principal al [phone] para una actualizacin sobre el [place] de cualquier retraso o cierre.  Consejos para la medicacin en dermatologa: Por favor, guarde las cajas en las que vienen los medicamentos de uso tpico para ayudarle a seguir las instrucciones sobre dnde y cmo usarlos. Las farmacias generalmente imprimen las instrucciones del medicamento slo en las cajas y no directamente en los tubos del [place].   Si su medicamento es muy caro, por favor, pngase en contacto con Bettyjane Brunet llamando al [phone] y presione la opcin 4 o envenos un mensaje a travs de Clinical cytogeneticist.   No podemos decirle cul ser su copago por los medicamentos por adelantado ya que esto es diferente  dependiendo de la cobertura de su seguro. Sin embargo, es posible que podamos encontrar un medicamento sustituto a Audiological scientist un formulario para que el seguro cubra el medicamento que se considera necesario.   Si se requiere una autorizacin previa para que su compaa de seguros [place] su medicamento, por favor permtanos de 1 a 2 das hbiles para completar este proceso.  Los precios de los medicamentos varan con frecuencia dependiendo del Environmental consultant de dnde se surte la receta y alguna farmacias pueden ofrecer precios ms baratos.  El sitio web www.goodrx.com tiene cupones para medicamentos de Health and safety inspector. Los precios aqu no tienen en cuenta lo que podra costar con la ayuda del seguro (puede ser ms barato con su seguro), pero el sitio web puede darle el precio si no utiliz Tourist information centre manager.  - Puede imprimir el cupn correspondiente y llevarlo con su receta a la farmacia.  - Tambin puede pasar por nuestra oficina durante el horario de atencin regular  y recoger una tarjeta de cupones de GoodRx.  - Si necesita que su receta se enve electrnicamente a una farmacia diferente, informe a nuestra oficina a travs de MyChart de [place] o por telfono llamando al [phone] y presione la opcin 4.

## 2023-12-14 LAB — CBC WITH DIFFERENTIAL/PLATELET
Basophils Absolute: 0.1 10*3/uL (ref 0.0–0.2)
Basos: 1 %
EOS (ABSOLUTE): 0.1 10*3/uL (ref 0.0–0.4)
Eos: 1 %
Hematocrit: 38.1 % (ref 34.0–46.6)
Hemoglobin: 11.3 g/dL (ref 11.1–15.9)
Immature Grans (Abs): 0 10*3/uL (ref 0.0–0.1)
Immature Granulocytes: 0 %
Lymphocytes Absolute: 2.3 10*3/uL (ref 0.7–3.1)
Lymphs: 37 %
MCH: 22 pg — ABNORMAL LOW (ref 26.6–33.0)
MCHC: 29.7 g/dL — ABNORMAL LOW (ref 31.5–35.7)
MCV: 74 fL — ABNORMAL LOW (ref 79–97)
Monocytes Absolute: 0.4 10*3/uL (ref 0.1–0.9)
Monocytes: 6 %
Neutrophils Absolute: 3.5 10*3/uL (ref 1.4–7.0)
Neutrophils: 55 %
Platelets: 382 10*3/uL (ref 150–450)
RBC: 5.13 x10E6/uL (ref 3.77–5.28)
RDW: 15.8 % — ABNORMAL HIGH (ref 11.7–15.4)
WBC: 6.3 10*3/uL (ref 3.4–10.8)

## 2023-12-14 LAB — HEPATITIS B SURFACE ANTIBODY,QUALITATIVE: Hep B Surface Ab, Qual: NONREACTIVE

## 2023-12-14 LAB — QUANTIFERON-TB GOLD PLUS
QuantiFERON Mitogen Value: >10 [IU]/mL
QuantiFERON Nil Value: 0.05 [IU]/mL
QuantiFERON TB1 Ag Value: 0.05 [IU]/mL
QuantiFERON TB2 Ag Value: 0.07 [IU]/mL
QuantiFERON-TB Gold Plus: NEGATIVE

## 2023-12-14 LAB — COMPREHENSIVE METABOLIC PANEL WITH GFR
ALT: 12 IU/L (ref 0–32)
AST: 16 IU/L (ref 0–40)
Albumin: 4.5 g/dL (ref 3.8–4.9)
Alkaline Phosphatase: 73 IU/L (ref 44–121)
BUN/Creatinine Ratio: 14 (ref 9–23)
BUN: 9 mg/dL (ref 6–24)
Bilirubin Total: 0.3 mg/dL (ref 0.0–1.2)
CO2: 20 mmol/L (ref 20–29)
Calcium: 9.9 mg/dL (ref 8.7–10.2)
Chloride: 105 mmol/L (ref 96–106)
Creatinine, Ser: 0.64 mg/dL (ref 0.57–1.00)
Globulin, Total: 2.8 g/dL (ref 1.5–4.5)
Glucose: 88 mg/dL (ref 70–99)
Potassium: 4 mmol/L (ref 3.5–5.2)
Sodium: 142 mmol/L (ref 134–144)
Total Protein: 7.3 g/dL (ref 6.0–8.5)
eGFR: 104 mL/min/{1.73_m2} (ref >59)

## 2023-12-14 LAB — HEPATITIS C ANTIBODY: Hep C Virus Ab: NONREACTIVE

## 2023-12-14 LAB — HIV ANTIBODY (ROUTINE TESTING W REFLEX): HIV Screen 4th Generation wRfx: NONREACTIVE

## 2023-12-14 LAB — HEPATITIS B SURFACE ANTIGEN: Hepatitis B Surface Ag: NEGATIVE

## 2023-12-16 ENCOUNTER — Ambulatory Visit: Payer: Self-pay | Admitting: Dermatology

## 2023-12-16 DIAGNOSIS — L409 Psoriasis, unspecified: Secondary | ICD-10-CM

## 2023-12-18 MED ORDER — BIMZELX 320 MG/2ML ~~LOC~~ SOAJ: 320.0000 mg | SUBCUTANEOUS | 3 refills | Status: AC

## 2023-12-18 MED ORDER — BIMZELX 320 MG/2ML ~~LOC~~ SOAJ: 320.0000 mg | SUBCUTANEOUS | 5 refills | Status: AC

## 2023-12-18 NOTE — Telephone Encounter (Addendum)
 Called and discussed lab results with patient. She verbalized understanding. Patient instructed to follow up with pcp regarding red blood cell size irregularities. She denied questions.   Bimzelx for psoriasis sent to [facility]. Patient instructed once she gets medication to call office and we will schedule her with follow up to see Dr. Bary Likes and for first loading injection.    ----- Message from [name] Collard sent at 12/16/2023  3:08 PM EDT ----- Labs from 12/11/2023 showed: Blood counts OK with some mild red blood cell size irregularities which have been apparent for at least 2 years.  Follow up with PCP concerning this. Chemistries including liver and kidney normal. Hepatitis B and C tests negative / normal HIV negative / normal TB test negative / normal  Pt has Psoriasis Please send Bimzelx for psoriasis Schedule appt with me  for loading dose when Bimzelx approved.

## 2024-01-14 ENCOUNTER — Telehealth: Payer: Self-pay

## 2024-01-14 NOTE — Telephone Encounter (Signed)
 Bimzelx  prior authorization denied by patient's insurance. Pt must have tried for at least three months or experienced an intolerance to at least one traditional systemic agent (MTX, cyclosporine, acitretin tablets); OR the patient has a contraindication to MTX. Please advise.

## 2024-01-15 ENCOUNTER — Ambulatory Visit: Payer: Self-pay | Admitting: Registered Nurse

## 2024-01-21 ENCOUNTER — Encounter: Payer: Self-pay | Admitting: Dermatology

## 2024-01-21 ENCOUNTER — Ambulatory Visit (INDEPENDENT_AMBULATORY_CARE_PROVIDER_SITE_OTHER): Admitting: Dermatology

## 2024-01-21 DIAGNOSIS — Z7189 Other specified counseling: Secondary | ICD-10-CM

## 2024-01-21 DIAGNOSIS — Z79899 Other long term (current) drug therapy: Secondary | ICD-10-CM | POA: Diagnosis not present

## 2024-01-21 DIAGNOSIS — L409 Psoriasis, unspecified: Secondary | ICD-10-CM

## 2024-01-21 MED ORDER — BIMEKIZUMAB-BKZX 320 MG/2ML ~~LOC~~ SOAJ
320.0000 mg | SUBCUTANEOUS | Status: AC
  Administered 2024-01-21 – 2024-04-15 (×4): 320 mg via SUBCUTANEOUS

## 2024-01-21 NOTE — Patient Instructions (Addendum)
 Remove Bemzelx box from the styrofoam cooler and place the Bemzelx box containing the injection in refrigerator.    Continue Bimzelx  injections every 4 weeks for 4 months until finished with loading dose then will    Reviewed risks of biologics including immunosuppression, infections, injection site reaction, and failure to improve condition. Goal is control of skin condition, not cure.  Some older biologics such as Humira and Enbrel may slightly increase risk of malignancy and may worsen congestive heart failure.  Taltz and Cosentyx may cause inflammatory bowel disease to flare. The use of biologics requires long term medication management, including periodic office visits and monitoring of blood work.    Due to recent changes in healthcare laws, you may see results of your pathology and/or laboratory studies on MyChart before the doctors have had a chance to review them. We understand that in some cases there may be results that are confusing or concerning to you. Please understand that not all results are received at the same time and often the doctors may need to interpret multiple results in order to provide you with the best plan of care or course of treatment. Therefore, we ask that you please give us  2 business days to thoroughly review all your results before contacting the office for clarification. Should we see a critical lab result, you will be contacted sooner.   If You Need Anything After Your Visit  If you have any questions or concerns for your doctor, please call our main line at [phone] and press option 4 to reach your doctor's medical assistant. If no one answers, please leave a voicemail as directed and we will return your call as soon as possible. Messages left after 4 pm will be answered the following business day.   You may also send us  a message via MyChart. We typically respond to MyChart messages within 1-2 business days.  For prescription refills, please ask your  pharmacy to contact our office. Our fax number is [phone].  If you have an urgent issue when the clinic is closed that cannot wait until the next business day, you can page your doctor at the number below.    Please note that while we do our best to be available for urgent issues outside of office hours, we are not available 24/7.   If you have an urgent issue and are unable to reach us , you may choose to seek medical care at your doctor's office, retail clinic, urgent care center, or emergency room.  If you have a medical emergency, please immediately call 911 or go to the emergency department.  Pager Numbers  - Dr. Hester: [phone]  - Dr. Jackquline: [phone]  - Dr. Claudene: [phone]   In the event of inclement weather, please call our main line at [phone] for an update on the status of any delays or closures.  Dermatology Medication Tips: Please keep the boxes that topical medications come in in order to help keep track of the instructions about where and how to use these. Pharmacies typically print the medication instructions only on the boxes and not directly on the medication tubes.   If your medication is too expensive, please contact our office at [phone] option 4 or send us  a message through MyChart.   We are unable to tell what your co-pay for medications will be in advance as this is different depending on your insurance coverage. However, we may be able to find a substitute medication at lower cost or fill out  paperwork to get insurance to cover a needed medication.   If a prior authorization is required to get your medication covered by your insurance company, please allow us  1-2 business days to complete this process.  Drug prices often vary depending on where the prescription is filled and some pharmacies may offer cheaper prices.  The website www.goodrx.com contains coupons for medications through different pharmacies. The prices here do not  account for what the cost may be with help from insurance (it may be cheaper with your insurance), but the website can give you the price if you did not use any insurance.  - You can print the associated coupon and take it with your prescription to the pharmacy.  - You may also stop by our office during regular business hours and pick up a GoodRx coupon card.  - If you need your prescription sent electronically to a different pharmacy, notify our office through [facility] or by phone at [phone] option 4.     Si Usted Necesita Algo Despus de Su Visita  Tambin puede enviarnos un mensaje a travs de Clinical cytogeneticist. Por lo general respondemos a los mensajes de MyChart en el transcurso de 1 a 2 das hbiles.  Para renovar recetas, por favor pida a su farmacia que se ponga en contacto con nuestra oficina. Randi lakes de fax es [employer] [phone].  Si tiene un asunto urgente cuando la clnica est cerrada y que no puede esperar hasta el siguiente da hbil, puede llamar/localizar a su doctor(a) al nmero que aparece a continuacin.   Por favor, tenga en cuenta que aunque hacemos todo lo posible para estar disponibles para asuntos urgentes fuera del horario de [place], no estamos disponibles las 24 horas del da, los 7 [address]  [address] de la [place].   Si tiene un problema urgente y no puede comunicarse con nosotros, puede optar por buscar atencin mdica  en el consultorio de su doctor(a), en una clnica privada, en un centro de atencin urgente o en una sala de emergencias.  Si tiene Engineer, drilling, por favor llame inmediatamente al 911 o vaya a la sala de emergencias.  Nmeros de bper  - Dr. Hester: [phone]  - Dra. Jackquline: [phone]  - Dr. Claudene: [phone]   En caso de inclemencias del tiempo, por favor llame a landry capes principal al [phone] para una actualizacin sobre el [place] de cualquier retraso o cierre.  Consejos para la medicacin en dermatologa: Por  favor, guarde las cajas en las que vienen los medicamentos de uso tpico para ayudarle a seguir las instrucciones sobre dnde y cmo usarlos. Las farmacias generalmente imprimen las instrucciones del medicamento slo en las cajas y no directamente en los tubos del [place].   Si su medicamento es muy caro, por favor, pngase en contacto con landry rieger llamando al [phone] y presione la opcin 4 o envenos un mensaje a travs de Clinical cytogeneticist.   No podemos decirle cul ser su copago por los medicamentos por adelantado ya que esto es diferente dependiendo de la cobertura de su seguro. Sin embargo, es posible que podamos encontrar un medicamento sustituto a Audiological scientist un formulario para que el seguro cubra el medicamento que se considera necesario.   Si se requiere una autorizacin previa para que su compaa de seguros malta su medicamento, por favor permtanos de 1 a 2 das hbiles para completar este proceso.  Los precios de los medicamentos varan con frecuencia dependiendo del Environmental consultant de dnde se surte la receta y  alguna farmacias pueden ofrecer precios ms baratos.  El sitio web www.goodrx.com tiene cupones para medicamentos de Health and safety inspector. Los precios aqu no tienen en cuenta lo que podra costar con la ayuda del seguro (puede ser ms barato con su seguro), pero el sitio web puede darle el precio si no utiliz Tourist information centre manager.  - Puede imprimir el cupn correspondiente y llevarlo con su receta a la farmacia.  - Tambin puede pasar por nuestra oficina durante el horario de atencin regular y Education officer, museum una tarjeta de cupones de GoodRx.  - Si necesita que su receta se enve electrnicamente a una farmacia diferente, informe a nuestra oficina a travs de MyChart de [place] o por telfono llamando al [phone] y presione la opcin 4.

## 2024-01-21 NOTE — Progress Notes (Signed)
   Follow-Up Visit   Subjective  Cheyenne Hubbard is a 58 y.o. female who presents for the following: Psoriasis. Here to start Bimzelx  injections. Unable to tolerate Otezla  due to side effects and inadequate results.   The following portions of the chart were reviewed this encounter and updated as appropriate: medications, allergies, medical history  Review of Systems:  No other skin or systemic complaints except as noted in HPI or Assessment and Plan.  Objective  Well appearing patient in no apparent distress; mood and affect are within normal limits.  Areas Examined: Legs, thighs, back  Relevant exam findings are noted in the Assessment and Plan.   Assessment & Plan   PSORIASIS   Related Medications bimekizumab -bkzx (BIMZELX ) 320 MG/2ML pen Inject 2 mLs (320 mg total) into the skin See admin instructions. Inject once on week 0, 4, 8, 12, and 16 bimekizumab -bkzx (BIMZELX ) 320 MG/2ML pen Inject 2 mLs (320 mg total) into the skin every 8 (eight) weeks. bimekizumab -bkzx (BIMZELX ) pen 320 mg   PSORIASIS Well-demarcated erythematous papules/plaques with silvery scale, guttate pink scaly papules, plaques are thinner and less scaly today . 20% BSA. Chronic and persistent condition with duration or expected duration over one year. Condition is symptomatic / bothersome to patient. Not to goal. Patient denies joint pain  Treatment Plan: Bimzelx  320 mg/2 ml pen injected into left upper arm. Patient tolerated well.  NDC: 49525-217-13 Lot: [id number] Exp: 11/05/2025  Patient is receiving medication delivery at her home. The pen she brought in today was frozen and unable to be injected.  Bimzelx  sample injected today. Will contact drug rep for additional sample.    Counseling on psoriasis and coordination of care  psoriasis is a chronic non-curable, but treatable genetic/hereditary disease that may have other systemic features affecting other organ systems such as joints (Psoriatic  Arthritis). It is associated with an increased risk of inflammatory bowel disease, heart disease, non-alcoholic fatty liver disease, and depression.  Treatments include light and laser treatments; topical medications; and systemic medications including oral and injectables.  Return in about 4 months (around 05/22/2024) for Psoriasis Follow Up, With Dr. Hester, 4 weeks Bimzelx  injection on nurse schedule.  I, Jill Parcell, CMA, am acting as scribe for Alm Hester, MD.   Documentation: I have reviewed the above documentation for accuracy and completeness, and I agree with the above.  Alm Hester, MD

## 2024-01-23 NOTE — Telephone Encounter (Signed)
 Patient has a scanned document of Bimzelx  being approved. aw

## 2024-02-18 ENCOUNTER — Ambulatory Visit

## 2024-02-18 DIAGNOSIS — L400 Psoriasis vulgaris: Secondary | ICD-10-CM

## 2024-02-18 NOTE — Progress Notes (Signed)
 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   Bimzelx  320mg /46mL pen injected into right upper arm. Patient tolerated injection well.   LOT: [id number] EXP: 11/05/2025  Alan Pizza, RMA

## 2024-03-18 ENCOUNTER — Ambulatory Visit (INDEPENDENT_AMBULATORY_CARE_PROVIDER_SITE_OTHER): Payer: Self-pay

## 2024-03-18 DIAGNOSIS — L409 Psoriasis, unspecified: Secondary | ICD-10-CM

## 2024-03-18 NOTE — Progress Notes (Addendum)
 Patient here for second loading dose (week 8) for Psoriasis Vulgaris.    Bimzelx  320mg /26mL pen injected into left upper arm. Patient tolerated injection well.    LOT: [id number] EXP: 07/03/2026   Alan Pizza, RMA

## 2024-04-15 ENCOUNTER — Ambulatory Visit (INDEPENDENT_AMBULATORY_CARE_PROVIDER_SITE_OTHER): Payer: Self-pay

## 2024-04-15 DIAGNOSIS — L409 Psoriasis, unspecified: Secondary | ICD-10-CM

## 2024-04-15 NOTE — Progress Notes (Addendum)
 Patient here for second loading dose (week 12) for Psoriasis Vulgaris.    Bimzelx  320mg /46mL pen injected into right upper arm. Patient tolerated injection well.    LOT: [id number] EXP: 12/05/2024   Alan Pizza, RMA

## 2024-05-19 ENCOUNTER — Ambulatory Visit (INDEPENDENT_AMBULATORY_CARE_PROVIDER_SITE_OTHER): Payer: Self-pay | Admitting: Dermatology

## 2024-05-19 DIAGNOSIS — Z7189 Other specified counseling: Secondary | ICD-10-CM

## 2024-05-19 DIAGNOSIS — L409 Psoriasis, unspecified: Secondary | ICD-10-CM

## 2024-05-19 DIAGNOSIS — Z79899 Other long term (current) drug therapy: Secondary | ICD-10-CM

## 2024-05-19 NOTE — Patient Instructions (Addendum)
 Recommend over the counter CeraVe cream daily.   Due to recent changes in healthcare laws, you may see results of your pathology and/or laboratory studies on MyChart before the doctors have had a chance to review them. We understand that in some cases there may be results that are confusing or concerning to you. Please understand that not all results are received at the same time and often the doctors may need to interpret multiple results in order to provide you with the best plan of care or course of treatment. Therefore, we ask that you please give us  2 business days to thoroughly review all your results before contacting the office for clarification. Should we see a critical lab result, you will be contacted sooner.   If You Need Anything After Your Visit  If you have any questions or concerns for your doctor, please call our main line at [phone] and press option 4 to reach your doctor's medical assistant. If no one answers, please leave a voicemail as directed and we will return your call as soon as possible. Messages left after 4 pm will be answered the following business day.   You may also send us  a message via MyChart. We typically respond to MyChart messages within 1-2 business days.  For prescription refills, please ask your pharmacy to contact our office. Our fax number is [phone].  If you have an urgent issue when the clinic is closed that cannot wait until the next business day, you can page your doctor at the number below.    Please note that while we do our best to be available for urgent issues outside of office hours, we are not available 24/7.   If you have an urgent issue and are unable to reach us , you may choose to seek medical care at your doctor's office, retail clinic, urgent care center, or emergency room.  If you have a medical emergency, please immediately call 911 or go to the emergency department.  Pager Numbers  - Dr. Hester: [phone]  - Dr.  Jackquline: [phone]  - Dr. Claudene: [phone]   - Dr. Raymund: [phone]  In the event of inclement weather, please call our main line at [phone] for an update on the status of any delays or closures.  Dermatology Medication Tips: Please keep the boxes that topical medications come in in order to help keep track of the instructions about where and how to use these. Pharmacies typically print the medication instructions only on the boxes and not directly on the medication tubes.   If your medication is too expensive, please contact our office at [phone] option 4 or send us  a message through MyChart.   We are unable to tell what your co-pay for medications will be in advance as this is different depending on your insurance coverage. However, we may be able to find a substitute medication at lower cost or fill out paperwork to get insurance to cover a needed medication.   If a prior authorization is required to get your medication covered by your insurance company, please allow us  1-2 business days to complete this process.  Drug prices often vary depending on where the prescription is filled and some pharmacies may offer cheaper prices.  The website www.goodrx.com contains coupons for medications through different pharmacies. The prices here do not account for what the cost may be with help from insurance (it may be cheaper with your insurance), but the website can give you the price if you did not  use any insurance.  - You can print the associated coupon and take it with your prescription to the pharmacy.  - You may also stop by our office during regular business hours and pick up a GoodRx coupon card.  - If you need your prescription sent electronically to a different pharmacy, notify our office through [facility] or by phone at [phone] option 4.     Si Usted Necesita Algo Despus de Su Visita  Tambin puede enviarnos un mensaje a travs de Clinical Cytogeneticist. Por lo  general respondemos a los mensajes de MyChart en el transcurso de 1 a 2 das hbiles.  Para renovar recetas, por favor pida a su farmacia que se ponga en contacto con nuestra oficina. Randi lakes de fax es [place] [phone].  Si tiene un asunto urgente cuando la clnica est cerrada y que no puede esperar hasta el siguiente da hbil, puede llamar/localizar a su doctor(a) al nmero que aparece a continuacin.   Por favor, tenga en cuenta que aunque hacemos todo lo posible para estar disponibles para asuntos urgentes fuera del horario de [place], no estamos disponibles las 24 horas del da, los 7 [address]  [address] de la [place].   Si tiene un problema urgente y no puede comunicarse con nosotros, puede optar por buscar atencin mdica  en el consultorio de su doctor(a), en una clnica privada, en un centro de atencin urgente o en una sala de emergencias.  Si tiene engineer, drilling, por favor llame inmediatamente al 911 o vaya a la sala de emergencias.  Nmeros de bper  - Dr. Hester: [phone]  - Dra. Jackquline: [phone]  - Dr. Claudene: [phone]  - Dra. Kitts: [phone]  En caso de inclemencias del [place], por favor llame a nuestra lnea principal al [phone] para una actualizacin sobre el [place] de cualquier retraso o cierre.  Consejos para la medicacin en dermatologa: Por favor, guarde las cajas en las que vienen los medicamentos de uso tpico para ayudarle a seguir las instrucciones sobre dnde y cmo usarlos. Las farmacias generalmente imprimen las instrucciones del medicamento slo en las cajas y no directamente en los tubos del [place].   Si su medicamento es muy caro, por favor, pngase en contacto con landry rieger llamando al [phone] y presione la opcin 4 o envenos un mensaje a travs de Clinical Cytogeneticist.   No podemos decirle cul ser su copago por los medicamentos por adelantado ya que esto es diferente dependiendo de la cobertura de su seguro. Sin embargo, es  posible que podamos encontrar un medicamento sustituto a audiological scientist un formulario para que el seguro cubra el medicamento que se considera necesario.   Si se requiere una autorizacin previa para que su compaa de seguros cubra su medicamento, por favor permtanos de 1 a 2 das hbiles para completar este proceso.  Los precios de los medicamentos varan con frecuencia dependiendo del environmental consultant de dnde se surte la receta y alguna farmacias pueden ofrecer precios ms baratos.  El sitio web www.goodrx.com tiene cupones para medicamentos de health and safety inspector. Los precios aqu no tienen en cuenta lo que podra costar con la ayuda del seguro (puede ser ms barato con su seguro), pero el sitio web puede darle el precio si no utiliz tourist information centre manager.  - Puede imprimir el cupn correspondiente y llevarlo con su receta a la farmacia.  - Tambin puede pasar por nuestra oficina durante el horario de atencin regular y education officer, museum una tarjeta de cupones de GoodRx.  - Si necesita que  su receta se enve electrnicamente a una farmacia diferente, informe a nuestra oficina a travs de MyChart de [place] o por telfono llamando al [phone] y presione la opcin 4.

## 2024-05-19 NOTE — Progress Notes (Signed)
   Follow-Up Visit   Subjective  Cheyenne Hubbard is a 58 y.o. female who presents for the following: Psoriasis - Bimzelx  follow up, pt tolerating medication well with no side effects. She should be on week 16 loading dose, but she did not receive shipment of medications, and our office doesn't have samples at this time. Pt states that psoriasis is improving and rough patches are starting to smooth.  The following portions of the chart were reviewed this encounter and updated as appropriate: medications, allergies, medical history  Review of Systems:  No other skin or systemic complaints except as noted in HPI or Assessment and Plan.  Objective  Well appearing patient in no apparent distress; mood and affect are within normal limits.  Areas Examined: The face and extremities  Relevant exam findings are noted in the Assessment and Plan.   Assessment & Plan    PSORIASIS - week 16 of Bimzlex 320mg /2mL Well-demarcated erythematous papules/plaques with silvery scale, guttate pink scaly papules. 15% BSA. Chronic and persistent condition with duration or expected duration over one year. Condition is improving with treatment but not currently at goal. Patient denies joint pain Treatment Plan: Continue Bimzelx  320 mg SQ Q8W. Patient will contact the office to schedule injection that was due today, and we will teach her how to self inject at home. Counseling on psoriasis and coordination of care  psoriasis is a chronic non-curable, but treatable genetic/hereditary disease that may have other systemic features affecting other organ systems such as joints (Psoriatic Arthritis). It is associated with an increased risk of inflammatory bowel disease, heart disease, non-alcoholic fatty liver disease, and depression.  Treatments include light and laser treatments; topical medications; and systemic medications including oral and injectables.  Return in about 4 months (around 09/19/2024) for  psoriasis/Bimzelx  follow up.  LILLETTE Rosina Mayans, CMA, am acting as scribe for Alm Rhyme, MD .   Documentation: I have reviewed the above documentation for accuracy and completeness, and I agree with the above.  Alm Rhyme, MD

## 2024-05-27 ENCOUNTER — Encounter: Payer: Self-pay | Admitting: Dermatology

## 2024-06-04 ENCOUNTER — Ambulatory Visit

## 2024-06-04 NOTE — Progress Notes (Signed)
 Patient here today for week 16 of loading dose for Psoriasis Vulgaris. This injection was delayed due to bridge program and shipping.   Bimzelx  320mg /29mL pen injected into left thigh. Patient tolerated injection well.  LOT: [id number] EXP: 12/18/2026  Alan Pizza, RMA

## 2024-07-09 ENCOUNTER — Ambulatory Visit: Payer: Self-pay

## 2024-07-09 NOTE — Telephone Encounter (Signed)
 FYI Only or Action Required?: FYI only for provider: Home care advised.  Patient was last seen in primary care on 06/07/2022 by Oley Bascom RAMAN, NP.  Called Nurse Triage reporting Epistaxis.  Symptoms began several days ago.  Interventions attempted: Nothing.  Symptoms are: stable.  Triage Disposition: Home Care  Patient/caregiver understands and will follow disposition?: Yes   Onset of sneezing with blood since yesterday. Blood only present when sneezing. Small amount, not always present. On Sunday was out in the cold putting gas in the car, thinks it may be related. Not taking blood thinners. No fever or cold symptoms. Denies feeling weak or dizzy. Home care advised. Advised pt can try using Vaseline ointment on inside of nose to help with dryness and/or afrin nasal spray. Advised to call back with worsening symptoms.   Copied from CRM [id number]. Topic: Clinical - Red Word Triage >> Jul 09, 2024  3:30 PM Wess RAMAN wrote: Red Word that prompted transfer to Nurse Triage: Sneezing and seeing blood Reason for Disposition  [1] Mild-moderate nosebleed AND [2] bleeding stopped now    Small amount of blood only when sneezing. Not every time. Not actively draining blood.  Answer Assessment - Initial Assessment Questions 1. AMOUNT OF BLEEDING: How bad is the bleeding? How much blood was lost? Has the bleeding stopped?     Small amount only when sneezing  2. ONSET: When did the nosebleed start?      2 days ago  3. FREQUENCY: How many nosebleeds have you had in the last 24 hours?      A few times  4. RECURRENT SYMPTOMS: Have there been other recent nosebleeds? If Yes, ask: How long did it take you to stop the bleeding? What worked best?      Denies  5. CAUSE: What do you think caused this nosebleed?     Sneezing  6. LOCAL FACTORS: Do you have any cold symptoms?, Have you been rubbing or picking at your nose?     Denies  7. SYSTEMIC FACTORS: Do you have high  blood pressure or any bleeding problems?     Denies  8. BLOOD THINNERS: Do you take any blood thinners? (e.g., aspirin, clopidogrel / Plavix, coumadin, heparin). Notes: Other strong blood thinners include: Arixtra (fondaparinux), Eliquis (apixaban), Pradaxa (dabigatran), and Xarelto (rivaroxaban).     Denies  9. OTHER SYMPTOMS: Do you have any other symptoms? (e.g., lightheadedness)     Denies  Protocols used: Nosebleed-A-AH

## 2024-07-10 ENCOUNTER — Telehealth: Payer: Self-pay | Admitting: Nurse Practitioner

## 2024-07-10 NOTE — Telephone Encounter (Signed)
 Please advise [facility]

## 2024-07-10 NOTE — Telephone Encounter (Signed)
 Copied from CRM #[id number]. Topic: General - Other >> Jul 10, 2024  5:23 PM Kevelyn M wrote: Reason for CRM: Patient calling back for [facility].   Call back # [phone]

## 2024-07-10 NOTE — Telephone Encounter (Signed)
 Lvm for call back . KH

## 2024-07-14 NOTE — Telephone Encounter (Signed)
 Lvm to confirm pt is feeling better after home care was advised. KH

## 2024-08-13 ENCOUNTER — Encounter: Payer: Self-pay | Admitting: Nurse Practitioner

## 2024-08-14 ENCOUNTER — Encounter: Admitting: Nurse Practitioner

## 2024-09-03 ENCOUNTER — Ambulatory Visit: Payer: Self-pay | Admitting: Nurse Practitioner

## 2024-09-15 ENCOUNTER — Ambulatory Visit: Admitting: Dermatology
